# Patient Record
Sex: Female | Born: 1990 | Hispanic: Yes | Marital: Married | Smoking: Never smoker
Health system: Southern US, Community
[De-identification: ages and names within clinical notes are randomized; demographics above are authoritative.]

## PROBLEM LIST (undated history)

## (undated) ENCOUNTER — Inpatient Hospital Stay (HOSPITAL_COMMUNITY): Payer: Self-pay

## (undated) DIAGNOSIS — R519 Headache, unspecified: Secondary | ICD-10-CM

## (undated) DIAGNOSIS — Z789 Other specified health status: Secondary | ICD-10-CM

## (undated) DIAGNOSIS — R51 Headache: Secondary | ICD-10-CM

## (undated) HISTORY — PX: CYST EXCISION: SHX5701

---

## 2014-10-07 ENCOUNTER — Encounter (HOSPITAL_COMMUNITY): Payer: Self-pay | Admitting: *Deleted

## 2014-10-07 ENCOUNTER — Inpatient Hospital Stay (HOSPITAL_COMMUNITY): Payer: Medicaid Other

## 2014-10-07 ENCOUNTER — Inpatient Hospital Stay (HOSPITAL_COMMUNITY)
Admission: AD | Admit: 2014-10-07 | Discharge: 2014-10-07 | Disposition: A | Discharge status: Home / Self Care | Payer: Medicaid Other | Source: Ambulatory Visit | Attending: Obstetrics & Gynecology | Admitting: Obstetrics & Gynecology

## 2014-10-07 DIAGNOSIS — O26891 Other specified pregnancy related conditions, first trimester: Secondary | ICD-10-CM | POA: Diagnosis not present

## 2014-10-07 DIAGNOSIS — Z87891 Personal history of nicotine dependence: Secondary | ICD-10-CM | POA: Insufficient documentation

## 2014-10-07 DIAGNOSIS — Z3491 Encounter for supervision of normal pregnancy, unspecified, first trimester: Secondary | ICD-10-CM

## 2014-10-07 DIAGNOSIS — O9989 Other specified diseases and conditions complicating pregnancy, childbirth and the puerperium: Secondary | ICD-10-CM

## 2014-10-07 DIAGNOSIS — O26899 Other specified pregnancy related conditions, unspecified trimester: Secondary | ICD-10-CM

## 2014-10-07 DIAGNOSIS — Z3A01 Less than 8 weeks gestation of pregnancy: Secondary | ICD-10-CM | POA: Insufficient documentation

## 2014-10-07 DIAGNOSIS — R109 Unspecified abdominal pain: Secondary | ICD-10-CM

## 2014-10-07 LAB — CBC
HCT: 35 % — ABNORMAL LOW (ref 36.0–46.0)
Hemoglobin: 11.5 g/dL — ABNORMAL LOW (ref 12.0–15.0)
MCH: 25.2 pg — ABNORMAL LOW (ref 26.0–34.0)
MCHC: 32.9 g/dL (ref 30.0–36.0)
MCV: 76.6 fL — ABNORMAL LOW (ref 78.0–100.0)
Platelets: 255 10*3/uL (ref 150–400)
RBC: 4.57 MIL/uL (ref 3.87–5.11)
RDW: 14.6 % (ref 11.5–15.5)
WBC: 9.7 10*3/uL (ref 4.0–10.5)

## 2014-10-07 LAB — URINALYSIS, ROUTINE W REFLEX MICROSCOPIC
BILIRUBIN URINE: NEGATIVE
GLUCOSE, UA: NEGATIVE mg/dL
HGB URINE DIPSTICK: NEGATIVE
KETONES UR: NEGATIVE mg/dL
Leukocytes, UA: NEGATIVE
Nitrite: NEGATIVE
PROTEIN: NEGATIVE mg/dL
Specific Gravity, Urine: 1.015 (ref 1.005–1.030)
UROBILINOGEN UA: 0.2 mg/dL (ref 0.0–1.0)
pH: 7 (ref 5.0–8.0)

## 2014-10-07 LAB — POCT PREGNANCY, URINE: PREG TEST UR: POSITIVE — AB

## 2014-10-07 LAB — HCG, QUANTITATIVE, PREGNANCY: hCG, Beta Chain, Quant, S: 17163 m[IU]/mL — ABNORMAL HIGH (ref <5)

## 2014-10-07 NOTE — MAU Provider Note (Signed)
  History     CSN: [id number]  Arrival date and time: 10/07/14 1710   First Provider Initiated Contact with Patient 10/07/14 1945      Chief Complaint  Patient presents with  . Abdominal Pain   Abdominal Pain This is a new problem. The current episode started in the past 7 days. The onset quality is gradual. The problem occurs intermittently. The problem has been waxing and waning. The pain is located in the LLQ and RLQ. The pain is mild. The quality of the pain is cramping. The abdominal pain does not radiate. Pertinent negatives include no constipation, diarrhea, fever, frequency, headaches, nausea or vomiting. She has tried nothing for the symptoms.   Emily Barnes 24 y.o. G1P0 @ [redacted]w[redacted]d presents to the MAU stating that she just wants to make sure everything is going smoothly in the pregnancy. She does complain of abominal cramping but denies any bleeding.  History reviewed. No pertinent past medical history.  History reviewed. No pertinent past surgical history.  History reviewed. No pertinent family history.  Social History  Substance Use Topics  . Smoking status: Former Games developer  . Smokeless tobacco: None  . Alcohol Use: No    Allergies: No Known Allergies  Prescriptions prior to admission  Medication Sig Dispense Refill Last Dose  . Prenatal Vit-Fe Fumarate-FA (PRENATAL MULTIVITAMIN) TABS tablet Take 1 tablet by mouth daily at 12 noon.   10/07/2014 at Unknown time    Review of Systems  Constitutional: Negative for fever and chills.  Respiratory: Negative for shortness of breath.   Cardiovascular: Negative for chest pain.  Gastrointestinal: Positive for abdominal pain. Negative for nausea, vomiting, diarrhea and constipation.  Genitourinary: Negative for frequency.  Neurological: Negative for dizziness and headaches.  All other systems reviewed and are negative.  Physical Exam   Blood pressure 109/61, pulse 78, temperature 98.1 F (36.7 C), temperature source Oral,  resp. rate 18, height 5' 9.5" (1.765 m), weight 121.564 kg (268 lb), last menstrual period 08/21/2014.  Physical Exam  Nursing note and vitals reviewed. Constitutional: She is oriented to person, place, and time. She appears well-developed and well-nourished. No distress.  HENT:  Head: Normocephalic and atraumatic.  Respiratory: Effort normal. No respiratory distress.  GI: Soft. There is no tenderness.  Musculoskeletal: Normal range of motion.  Neurological: She is alert and oriented to person, place, and time.  Skin: Skin is warm and dry.  Psychiatric: She has a normal mood and affect. Her behavior is normal. Judgment and thought content normal.    MAU Course  Procedures  MDM Cbc, Ultrasound, beta HCG, ABO ordered. Turn ovr care to Collene Gobble, cnm  Assessment and Plan    Clemmons,Lori Grissett 10/07/2014, 7:47 PM   [place] with preliminary report of IUP with FHR 114, [place] [redacted]w[redacted]d by CRL  A: 1. Normal IUP (intrauterine pregnancy) on prenatal ultrasound, first trimester   2. Cramping affecting pregnancy, antepartum     P: D/C home F/U with early prenatal care. List of providers given. Return to MAU as needed for emergencies  LEFTWICH-KIRBY, Paizleigh Wilds, CNM 9:01 PM
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
## 2014-10-07 NOTE — Discharge Instructions (Signed)

## 2014-10-07 NOTE — MAU Note (Addendum)
PT SAYS  SHE WENT TO   [facility]-  ON 8-7-    DID UPT  - SAID  NEG.  .   ON  Friday  - HPT   SAID  POSITIVE   NO BLEEDING.  FEELS SOME CRAMPING-   FOR SEVERAL  DAYS -  NO MEDS.   LAST SEX-  8-21.  NO BIRTH  CONTROL.   HAS AN APPOINTMENT  WITH  DR  MARSHALL  ON 9-13

## 2014-10-08 LAB — ABO/RH: ABO/RH(D): AB POS

## 2014-10-28 LAB — OB RESULTS CONSOLE RPR: RPR: NONREACTIVE

## 2014-10-28 LAB — OB RESULTS CONSOLE ABO/RH: RH TYPE: POSITIVE

## 2014-10-28 LAB — OB RESULTS CONSOLE HEPATITIS B SURFACE ANTIGEN: Hepatitis B Surface Ag: NEGATIVE

## 2014-10-28 LAB — OB RESULTS CONSOLE HIV ANTIBODY (ROUTINE TESTING): HIV: NONREACTIVE

## 2014-10-28 LAB — OB RESULTS CONSOLE GC/CHLAMYDIA
Chlamydia: NEGATIVE
Gonorrhea: NEGATIVE

## 2014-10-28 LAB — OB RESULTS CONSOLE ANTIBODY SCREEN: Antibody Screen: NEGATIVE

## 2014-10-28 LAB — OB RESULTS CONSOLE RUBELLA ANTIBODY, IGM: Rubella: IMMUNE

## 2015-02-15 NOTE — L&D Delivery Note (Signed)
Delivery Note At 8:55 AM a viable female was delivered via Vaginal, Spontaneous Delivery (Presentation: Left Occiput Anterior).  APGAR: 9, 9; weight 7 lb 15.7 oz (3620 g).   Placenta status: Intact, Spontaneous.  Cord: 3 vessels with the following complications: .  Cord pH: none  Anesthesia: Epidural  Episiotomy: None Lacerations: Vaginal;1st degree Suture Repair: 3.0 vicryl rapide Est. Blood Loss (mL): 350  Mom to postpartum.  Baby to Couplet care / Skin to Skin.  Elloise Roark A 05/30/2015, 10:28 AM

## 2015-04-18 ENCOUNTER — Inpatient Hospital Stay (HOSPITAL_COMMUNITY)
Admission: AD | Admit: 2015-04-18 | Discharge: 2015-04-18 | Disposition: A | Discharge status: Home / Self Care | Payer: Medicaid Other | Source: Ambulatory Visit | Attending: Obstetrics | Admitting: Obstetrics

## 2015-04-18 ENCOUNTER — Encounter (HOSPITAL_COMMUNITY): Payer: Self-pay

## 2015-04-18 DIAGNOSIS — Z87891 Personal history of nicotine dependence: Secondary | ICD-10-CM | POA: Diagnosis not present

## 2015-04-18 DIAGNOSIS — R102 Pelvic and perineal pain: Secondary | ICD-10-CM | POA: Insufficient documentation

## 2015-04-18 DIAGNOSIS — Z3A34 34 weeks gestation of pregnancy: Secondary | ICD-10-CM | POA: Insufficient documentation

## 2015-04-18 DIAGNOSIS — O26893 Other specified pregnancy related conditions, third trimester: Secondary | ICD-10-CM | POA: Insufficient documentation

## 2015-04-18 DIAGNOSIS — E860 Dehydration: Secondary | ICD-10-CM

## 2015-04-18 DIAGNOSIS — N949 Unspecified condition associated with female genital organs and menstrual cycle: Secondary | ICD-10-CM

## 2015-04-18 DIAGNOSIS — R319 Hematuria, unspecified: Secondary | ICD-10-CM | POA: Diagnosis present

## 2015-04-18 HISTORY — DX: Headache, unspecified: R51.9

## 2015-04-18 HISTORY — DX: Headache: R51

## 2015-04-18 LAB — WET PREP, GENITAL
Clue Cells Wet Prep HPF POC: NONE SEEN
Trich, Wet Prep: NONE SEEN
Yeast Wet Prep HPF POC: NONE SEEN

## 2015-04-18 LAB — URINALYSIS, ROUTINE W REFLEX MICROSCOPIC
BILIRUBIN URINE: NEGATIVE
GLUCOSE, UA: NEGATIVE mg/dL
HGB URINE DIPSTICK: NEGATIVE
KETONES UR: 15 mg/dL — AB
Leukocytes, UA: NEGATIVE
Nitrite: NEGATIVE
PROTEIN: NEGATIVE mg/dL
Specific Gravity, Urine: 1.015 (ref 1.005–1.030)
pH: 6.5 (ref 5.0–8.0)

## 2015-04-18 NOTE — MAU Provider Note (Signed)
History     CSN: [id number]  Arrival date and time: 04/18/15 1814   First Provider Initiated Contact with Patient 04/18/15 1905      Chief Complaint  Patient presents with  . Hematuria   HPI   Ms.Saumya Hukill is a  25 y.o. female presenting with ? Hematuria. She first noticed it 2 hours ago; she noticed it in the toilet.  It looked like "drops of blood in the toilet." She denies bleeding at this time or leaking of fluid. + fetal movements. No history of placenta previa.   + bilateral lower back pain; this is not new  + pelvic pressure; this is not new although feels worse today. The pressure is located in the center of her abdomen.   Last intercourse was yesterday.   OB History    Gravida Para Term Preterm AB TAB SAB Ectopic Multiple Living   1               Past Medical History  Diagnosis Date  . Headache     History reviewed. No pertinent past surgical history.  History reviewed. No pertinent family history.  Social History  Substance Use Topics  . Smoking status: Former Games developer  . Smokeless tobacco: None  . Alcohol Use: No    Allergies: No Known Allergies  Prescriptions prior to admission  Medication Sig Dispense Refill Last Dose  . Prenatal Vit-Fe Fumarate-FA (PRENATAL MULTIVITAMIN) TABS tablet Take 1 tablet by mouth daily at 12 noon.   10/07/2014 at Unknown time   Results for orders placed or performed during the hospital encounter of 04/18/15 (from the past 48 hour(s))  Urinalysis, Routine w reflex microscopic (not at [facility])     Status: Abnormal   Collection Time: 04/18/15  6:40 PM  Result Value Ref Range   Color, Urine YELLOW YELLOW   APPearance CLEAR CLEAR   Specific Gravity, Urine 1.015 1.005 - 1.030   pH 6.5 5.0 - 8.0   Glucose, UA NEGATIVE NEGATIVE mg/dL   Hgb urine dipstick NEGATIVE NEGATIVE   Bilirubin Urine NEGATIVE NEGATIVE   Ketones, ur 15 (A) NEGATIVE mg/dL   Protein, ur NEGATIVE NEGATIVE mg/dL   Nitrite NEGATIVE NEGATIVE   Leukocytes,  UA NEGATIVE NEGATIVE    Comment: MICROSCOPIC NOT DONE ON URINES WITH NEGATIVE PROTEIN, BLOOD, LEUKOCYTES, NITRITE, OR GLUCOSE <1000 mg/dL.  Wet prep, genital     Status: Abnormal   Collection Time: 04/18/15  7:15 PM  Result Value Ref Range   Yeast Wet Prep HPF POC NONE SEEN NONE SEEN   Trich, Wet Prep NONE SEEN NONE SEEN   Clue Cells Wet Prep HPF POC NONE SEEN NONE SEEN   WBC, Wet Prep HPF POC MODERATE (A) NONE SEEN    Comment: MODERATE BACTERIA SEEN   Sperm PRESENT     Review of Systems  Constitutional: Negative for fever.  Gastrointestinal: Negative for nausea and vomiting.  Genitourinary: Negative for dysuria, urgency and frequency.  Musculoskeletal: Positive for back pain.   Physical Exam   Blood pressure 115/69, pulse 101, temperature 97.9 F (36.6 C), temperature source Oral, resp. rate 18, height  (1.753 m), weight 280 lb 9.6 oz (127.279 kg), last menstrual period 08/21/2014.  Physical Exam  Constitutional: She is oriented to person, place, and time. She appears well-developed and well-nourished. No distress.  HENT:  Head: Normocephalic.  Eyes: Pupils are equal, round, and reactive to light.  Cardiovascular: Normal rate and normal heart sounds.   Respiratory: Effort normal and breath  sounds normal.  GI: Soft. There is tenderness in the suprapubic area. There is no rigidity, no rebound, no guarding and no CVA tenderness.  Genitourinary:  Speculum exam: Vagina - Small amount of creamy discharge, no odor Cervix - No contact bleeding, no active bleeding  Bimanual exam: Cervix closed Wet prep done Chaperone present for exam.  Musculoskeletal: Normal range of motion.  Neurological: She is alert and oriented to person, place, and time.  Skin: Skin is warm. She is not diaphoretic.  Psychiatric: Her behavior is normal.   Fetal Tracing: Baseline: 130 bpm  Variability: moderate  Accelerations: 15x15 Decelerations: none   Toco: quiet   MAU Course  Procedures   None  MDM UA Urine culture pending  Assessment and Plan   A:  1. Pelvic pressure in pregnancy, antepartum, third trimester   2. Mild dehydration     P:  Discharge home in stable condition Fetal kick counts Increase PO fluid intake Return to MAU if bleeding returns Follow up with OB on Monday; call. Preterm labor precautions.   Duane LopeJennifer I Emelin Dascenzo, NP 04/18/2015 8:17 PM

## 2015-04-18 NOTE — MAU Note (Signed)
Patient states she went to the restroom saw blood in urine unsure if vaginally, having a pinching back pain and lower abdominal pressure has been happening intermittently for a while, denies any prenatal issues or problems.

## 2015-04-18 NOTE — Discharge Instructions (Signed)

## 2015-04-20 LAB — CULTURE, OB URINE: SPECIAL REQUESTS: NORMAL

## 2015-04-29 ENCOUNTER — Ambulatory Visit (INDEPENDENT_AMBULATORY_CARE_PROVIDER_SITE_OTHER): Payer: Self-pay | Admitting: Pediatrics

## 2015-04-29 DIAGNOSIS — Z3490 Encounter for supervision of normal pregnancy, unspecified, unspecified trimester: Secondary | ICD-10-CM | POA: Insufficient documentation

## 2015-04-29 DIAGNOSIS — Z7681 Expectant parent(s) prebirth pediatrician visit: Secondary | ICD-10-CM

## 2015-04-29 NOTE — Progress Notes (Signed)
Prenatal counseling for impending newborn done-- Z76.81  

## 2015-05-05 ENCOUNTER — Encounter (HOSPITAL_COMMUNITY): Payer: Self-pay

## 2015-05-05 ENCOUNTER — Inpatient Hospital Stay (HOSPITAL_COMMUNITY)
Admission: EM | Admit: 2015-05-05 | Discharge: 2015-05-05 | Disposition: A | Discharge status: Home / Self Care | Payer: Medicaid Other | Attending: Obstetrics & Gynecology | Admitting: Obstetrics & Gynecology

## 2015-05-05 ENCOUNTER — Emergency Department (HOSPITAL_COMMUNITY): Payer: Medicaid Other

## 2015-05-05 DIAGNOSIS — M25512 Pain in left shoulder: Secondary | ICD-10-CM | POA: Insufficient documentation

## 2015-05-05 DIAGNOSIS — Y92410 Unspecified street and highway as the place of occurrence of the external cause: Secondary | ICD-10-CM | POA: Insufficient documentation

## 2015-05-05 DIAGNOSIS — O9A213 Injury, poisoning and certain other consequences of external causes complicating pregnancy, third trimester: Secondary | ICD-10-CM

## 2015-05-05 DIAGNOSIS — R079 Chest pain, unspecified: Secondary | ICD-10-CM | POA: Insufficient documentation

## 2015-05-05 DIAGNOSIS — Z3A37 37 weeks gestation of pregnancy: Secondary | ICD-10-CM | POA: Insufficient documentation

## 2015-05-05 HISTORY — DX: Other specified health status: Z78.9

## 2015-05-05 LAB — CBC
HEMATOCRIT: 34.8 % — AB (ref 36.0–46.0)
Hemoglobin: 11.4 g/dL — ABNORMAL LOW (ref 12.0–15.0)
MCH: 24.8 pg — ABNORMAL LOW (ref 26.0–34.0)
MCHC: 32.8 g/dL (ref 30.0–36.0)
MCV: 75.8 fL — AB (ref 78.0–100.0)
Platelets: 252 10*3/uL (ref 150–400)
RBC: 4.59 MIL/uL (ref 3.87–5.11)
RDW: 13.9 % (ref 11.5–15.5)
WBC: 9 10*3/uL (ref 4.0–10.5)

## 2015-05-05 LAB — COMPREHENSIVE METABOLIC PANEL
ALBUMIN: 2.8 g/dL — AB (ref 3.5–5.0)
ALK PHOS: 205 U/L — AB (ref 38–126)
ALT: 27 U/L (ref 14–54)
AST: 22 U/L (ref 15–41)
Anion gap: 13 (ref 5–15)
CHLORIDE: 106 mmol/L (ref 101–111)
CO2: 19 mmol/L — ABNORMAL LOW (ref 22–32)
Calcium: 9.2 mg/dL (ref 8.9–10.3)
Creatinine, Ser: 0.79 mg/dL (ref 0.44–1.00)
GFR calc non Af Amer: >60 mL/min (ref >60)
GLUCOSE: 88 mg/dL (ref 65–99)
Potassium: 4 mmol/L (ref 3.5–5.1)
SODIUM: 138 mmol/L (ref 135–145)
Total Bilirubin: 0.3 mg/dL (ref 0.3–1.2)
Total Protein: 7.1 g/dL (ref 6.5–8.1)

## 2015-05-05 MED ORDER — SODIUM CHLORIDE 0.9 % IV BOLUS (SEPSIS)
1000.0000 mL | Freq: Once | INTRAVENOUS | Status: AC
  Administered 2015-05-05: 1000 mL via INTRAVENOUS
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
## 2015-05-05 NOTE — ED Notes (Signed)
This note also relates to the following rows which could not be included: Resp - Cannot attach notes to unvalidated device data   efm off for bedside US.  Fetal HR observed @ normal rate.  Cephalic.
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
## 2015-05-06 ENCOUNTER — Encounter (HOSPITAL_COMMUNITY): Payer: Self-pay

## 2015-05-22 ENCOUNTER — Encounter (HOSPITAL_COMMUNITY): Payer: Self-pay | Admitting: *Deleted

## 2015-05-22 ENCOUNTER — Inpatient Hospital Stay (HOSPITAL_COMMUNITY)
Admission: AD | Admit: 2015-05-22 | Discharge: 2015-05-22 | Disposition: A | Discharge status: Home / Self Care | Payer: Medicaid Other | Source: Ambulatory Visit | Attending: Obstetrics | Admitting: Obstetrics

## 2015-05-22 DIAGNOSIS — O368130 Decreased fetal movements, third trimester, not applicable or unspecified: Secondary | ICD-10-CM

## 2015-05-22 DIAGNOSIS — O368131 Decreased fetal movements, third trimester, fetus 1: Secondary | ICD-10-CM

## 2015-05-22 DIAGNOSIS — Z3A39 39 weeks gestation of pregnancy: Secondary | ICD-10-CM | POA: Diagnosis not present

## 2015-05-22 DIAGNOSIS — O368190 Decreased fetal movements, unspecified trimester, not applicable or unspecified: Secondary | ICD-10-CM | POA: Diagnosis present

## 2015-05-22 NOTE — MAU Provider Note (Signed)
  History     CSN: [id number]  Arrival date and time: 05/22/15 1857   First Provider Initiated Contact with Patient 05/22/15 2026      Chief Complaint  Patient presents with  . Decreased Fetal Movement   HPI  Emily Barnes 24 y.o. G1P0000 [id number] presents to mau saying that she has not felt the baby move since this morning. She denies contractions, vaginal bleeding , lof.  Past Medical History  Diagnosis Date  . Headache   . Medical history non-contributory     Past Surgical History  Procedure Laterality Date  . Cyst excision Left     axilla    History reviewed. No pertinent family history.  Social History  Substance Use Topics  . Smoking status: Never Smoker   . Smokeless tobacco: Never Used  . Alcohol Use: No    Allergies: No Known Allergies  No prescriptions prior to admission    Review of Systems  Constitutional: Negative for fever.  Genitourinary:       Decreased fetal movement  All other systems reviewed and are negative.  Physical Exam   Blood pressure 111/68, pulse 114, temperature 97.7 F (36.5 C), resp. rate 18, height 5\' 8"  (1.727 m), weight 279 lb 6.4 oz (126.735 kg), last menstrual period 08/21/2014.  Physical Exam  Nursing note and vitals reviewed. Constitutional: She is oriented to person, place, and time. She appears well-developed and well-nourished. No distress.  HENT:  Head: Normocephalic and atraumatic.  Cardiovascular: Normal rate.   Respiratory: Effort normal. No respiratory distress.  GI: Soft.  Neurological: She is alert and oriented to person, place, and time.  Skin: Skin is warm and dry.  Psychiatric: She has a normal mood and affect. Her behavior is normal. Judgment and thought content normal.    MAU Course  Procedures  MDM FHR-Category one Reactive tracing occasional contractions; Reassured and encouraged pt to continue kick counts  Assessment and Plan  Decreased fetal movement  Discharge  Clemmons,Lori  Grissett 05/22/2015, 8:33 PM

## 2015-05-22 NOTE — Progress Notes (Signed)
Written and verbal d/c instructions given and understanding voiced. 

## 2015-05-22 NOTE — Discharge Instructions (Signed)
Fetal Movement Counts  Patient Name: __________________________________________________ Patient Due Date: ____________________  Performing a fetal movement count is highly recommended in high-risk pregnancies, but it is good for every pregnant woman to do. Your health care provider may ask you to start counting fetal movements at 28 weeks of the pregnancy. Fetal movements often increase:  · After eating a full meal.  · After physical activity.  · After eating or drinking something sweet or cold.  · At rest.  Pay attention to when you feel the baby is most active. This will help you notice a pattern of your baby's sleep and wake cycles and what factors contribute to an increase in fetal movement. It is important to perform a fetal movement count at the same time each day when your baby is normally most active.   HOW TO COUNT FETAL MOVEMENTS  1. Find a quiet and comfortable area to sit or lie down on your left side. Lying on your left side provides the best blood and oxygen circulation to your baby.  2. Write down the day and time on a sheet of paper or in a journal.  3. Start counting kicks, flutters, swishes, rolls, or jabs in a 2-hour period. You should feel at least 10 movements within 2 hours.  4. If you do not feel 10 movements in 2 hours, wait 2-3 hours and count again. Look for a change in the pattern or not enough counts in 2 hours.  SEEK MEDICAL CARE IF:  · You feel less than 10 counts in 2 hours, tried twice.  · There is no movement in over an hour.  · The pattern is changing or taking longer each day to reach 10 counts in 2 hours.  · You feel the baby is not moving as he or she usually does.  Date: ____________ Movements: ____________ Start time: ____________ Finish time: ____________   Date: ____________ Movements: ____________ Start time: ____________ Finish time: ____________  Date: ____________ Movements: ____________ Start time: ____________ Finish time: ____________  Date: ____________ Movements:  ____________ Start time: ____________ Finish time: ____________  Date: ____________ Movements: ____________ Start time: ____________ Finish time: ____________  Date: ____________ Movements: ____________ Start time: ____________ Finish time: ____________  Date: ____________ Movements: ____________ Start time: ____________ Finish time: ____________  Date: ____________ Movements: ____________ Start time: ____________ Finish time: ____________   Date: ____________ Movements: ____________ Start time: ____________ Finish time: ____________  Date: ____________ Movements: ____________ Start time: ____________ Finish time: ____________  Date: ____________ Movements: ____________ Start time: ____________ Finish time: ____________  Date: ____________ Movements: ____________ Start time: ____________ Finish time: ____________  Date: ____________ Movements: ____________ Start time: ____________ Finish time: ____________  Date: ____________ Movements: ____________ Start time: ____________ Finish time: ____________  Date: ____________ Movements: ____________ Start time: ____________ Finish time: ____________   Date: ____________ Movements: ____________ Start time: ____________ Finish time: ____________  Date: ____________ Movements: ____________ Start time: ____________ Finish time: ____________  Date: ____________ Movements: ____________ Start time: ____________ Finish time: ____________  Date: ____________ Movements: ____________ Start time: ____________ Finish time: ____________  Date: ____________ Movements: ____________ Start time: ____________ Finish time: ____________  Date: ____________ Movements: ____________ Start time: ____________ Finish time: ____________  Date: ____________ Movements: ____________ Start time: ____________ Finish time: ____________   Date: ____________ Movements: ____________ Start time: ____________ Finish time: ____________  Date: ____________ Movements: ____________ Start time: ____________ Finish  time: ____________  Date: ____________ Movements: ____________ Start time: ____________ Finish time: ____________  Date: ____________ Movements: ____________ Start time:   ____________ Finish time: ____________  Date: ____________ Movements: ____________ Start time: ____________ Finish time: ____________  Date: ____________ Movements: ____________ Start time: ____________ Finish time: ____________  Date: ____________ Movements: ____________ Start time: ____________ Finish time: ____________   Date: ____________ Movements: ____________ Start time: ____________ Finish time: ____________  Date: ____________ Movements: ____________ Start time: ____________ Finish time: ____________  Date: ____________ Movements: ____________ Start time: ____________ Finish time: ____________  Date: ____________ Movements: ____________ Start time: ____________ Finish time: ____________  Date: ____________ Movements: ____________ Start time: ____________ Finish time: ____________  Date: ____________ Movements: ____________ Start time: ____________ Finish time: ____________  Date: ____________ Movements: ____________ Start time: ____________ Finish time: ____________   Date: ____________ Movements: ____________ Start time: ____________ Finish time: ____________  Date: ____________ Movements: ____________ Start time: ____________ Finish time: ____________  Date: ____________ Movements: ____________ Start time: ____________ Finish time: ____________  Date: ____________ Movements: ____________ Start time: ____________ Finish time: ____________  Date: ____________ Movements: ____________ Start time: ____________ Finish time: ____________  Date: ____________ Movements: ____________ Start time: ____________ Finish time: ____________  Date: ____________ Movements: ____________ Start time: ____________ Finish time: ____________   Date: ____________ Movements: ____________ Start time: ____________ Finish time: ____________  Date: ____________  Movements: ____________ Start time: ____________ Finish time: ____________  Date: ____________ Movements: ____________ Start time: ____________ Finish time: ____________  Date: ____________ Movements: ____________ Start time: ____________ Finish time: ____________  Date: ____________ Movements: ____________ Start time: ____________ Finish time: ____________  Date: ____________ Movements: ____________ Start time: ____________ Finish time: ____________  Date: ____________ Movements: ____________ Start time: ____________ Finish time: ____________   Date: ____________ Movements: ____________ Start time: ____________ Finish time: ____________  Date: ____________ Movements: ____________ Start time: ____________ Finish time: ____________  Date: ____________ Movements: ____________ Start time: ____________ Finish time: ____________  Date: ____________ Movements: ____________ Start time: ____________ Finish time: ____________  Date: ____________ Movements: ____________ Start time: ____________ Finish time: ____________  Date: ____________ Movements: ____________ Start time: ____________ Finish time: ____________     This information is not intended to replace advice given to you by your health care provider. Make sure you discuss any questions you have with your health care provider.     Document Released: 03/02/2006 Document Revised: 02/21/2014 Document Reviewed: 11/28/2011  [employer] Interactive Patient Education ©2016 [employer]

## 2015-05-22 NOTE — MAU Note (Signed)
Has only felt FM one time today. Denies LOF or bleeding and no pain

## 2015-05-28 ENCOUNTER — Inpatient Hospital Stay (HOSPITAL_COMMUNITY)
Admission: AD | Admit: 2015-05-28 | Discharge: 2015-05-28 | Disposition: A | Discharge status: Home / Self Care | Payer: Medicaid Other | Source: Ambulatory Visit | Attending: Obstetrics | Admitting: Obstetrics

## 2015-05-28 ENCOUNTER — Encounter (HOSPITAL_COMMUNITY): Payer: Self-pay | Admitting: *Deleted

## 2015-05-28 ENCOUNTER — Encounter (HOSPITAL_COMMUNITY): Payer: Self-pay

## 2015-05-28 NOTE — MAU Note (Signed)
Pt states she has been having contractions and some bleeding this am.

## 2015-05-28 NOTE — Discharge Instructions (Signed)
Third Trimester of Pregnancy °The third trimester is from week 29 through week 42, months 7 through 9. The third trimester is a time when the fetus is growing rapidly. At the end of the ninth month, the fetus is about 20 inches in length and weighs 6-10 pounds.  °BODY CHANGES °Your body goes through many changes during pregnancy. The changes vary from woman to woman.  °· Your weight will continue to increase. You can expect to gain 25-35 pounds (11-16 kg) by the end of the pregnancy. °· You may begin to get stretch marks on your hips, abdomen, and breasts. °· You may urinate more often because the fetus is moving lower into your pelvis and pressing on your bladder. °· You may develop or continue to have heartburn as a result of your pregnancy. °· You may develop constipation because certain hormones are causing the muscles that push waste through your intestines to slow down. °· You may develop hemorrhoids or swollen, bulging veins (varicose veins). °· You may have pelvic pain because of the weight gain and pregnancy hormones relaxing your joints between the bones in your pelvis. Backaches may result from overexertion of the muscles supporting your posture. °· You may have changes in your hair. These can include thickening of your hair, rapid growth, and changes in texture. Some women also have hair loss during or after pregnancy, or hair that feels dry or thin. Your hair will most likely return to normal after your baby is born. °· Your breasts will continue to grow and be tender. A yellow discharge may leak from your breasts called colostrum. °· Your belly button may stick out. °· You may feel short of breath because of your expanding uterus. °· You may notice the fetus "dropping," or moving lower in your abdomen. °· You may have a bloody mucus discharge. This usually occurs a few days to a week before labor begins. °· Your cervix becomes thin and soft (effaced) near your due date. °WHAT TO EXPECT AT YOUR PRENATAL  EXAMS  °You will have prenatal exams every 2 weeks until week 36. Then, you will have weekly prenatal exams. During a routine prenatal visit: °· You will be weighed to make sure you and the fetus are growing normally. °· Your blood pressure is taken. °· Your abdomen will be measured to track your baby's growth. °· The fetal heartbeat will be listened to. °· Any test results from the previous visit will be discussed. °· You may have a cervical check near your due date to see if you have effaced. °At around 36 weeks, your caregiver will check your cervix. At the same time, your caregiver will also perform a test on the secretions of the vaginal tissue. This test is to determine if a type of bacteria, Group B streptococcus, is present. Your caregiver will explain this further. °Your caregiver may ask you: °· What your birth plan is. °· How you are feeling. °· If you are feeling the baby move. °· If you have had any abnormal symptoms, such as leaking fluid, bleeding, severe headaches, or abdominal cramping. °· If you are using any tobacco products, including cigarettes, chewing tobacco, and electronic cigarettes. °· If you have any questions. °Other tests or screenings that may be performed during your third trimester include: °· Blood tests that check for low iron levels (anemia). °· Fetal testing to check the health, activity level, and growth of the fetus. Testing is done if you have certain medical conditions or if   there are problems during the pregnancy. °· HIV (human immunodeficiency virus) testing. If you are at high risk, you may be screened for HIV during your third trimester of pregnancy. °FALSE LABOR °You may feel small, irregular contractions that eventually go away. These are called Braxton Hicks contractions, or false labor. Contractions may last for hours, days, or even weeks before true labor sets in. If contractions come at regular intervals, intensify, or become painful, it is best to be seen by your  caregiver.  °SIGNS OF LABOR  °· Menstrual-like cramps. °· Contractions that are 5 minutes apart or less. °· Contractions that start on the top of the uterus and spread down to the lower abdomen and back. °· A sense of increased pelvic pressure or back pain. °· A watery or bloody mucus discharge that comes from the vagina. °If you have any of these signs before the 37th week of pregnancy, call your caregiver right away. You need to go to the hospital to get checked immediately. °HOME CARE INSTRUCTIONS  °· Avoid all smoking, herbs, alcohol, and unprescribed drugs. These chemicals affect the formation and growth of the baby. °· Do not use any tobacco products, including cigarettes, chewing tobacco, and electronic cigarettes. If you need help quitting, ask your health care provider. You may receive counseling support and other resources to help you quit. °· Follow your caregiver's instructions regarding medicine use. There are medicines that are either safe or unsafe to take during pregnancy. °· Exercise only as directed by your caregiver. Experiencing uterine cramps is a good sign to stop exercising. °· Continue to eat regular, healthy meals. °· Wear a good support bra for breast tenderness. °· Do not use hot tubs, steam rooms, or saunas. °· Wear your seat belt at all times when driving. °· Avoid raw meat, uncooked cheese, cat litter boxes, and soil used by cats. These carry germs that can cause birth defects in the baby. °· Take your prenatal vitamins. °· Take 1500-2000 mg of calcium daily starting at the 20th week of pregnancy until you deliver your baby. °· Try taking a stool softener (if your caregiver approves) if you develop constipation. Eat more high-fiber foods, such as fresh vegetables or fruit and whole grains. Drink plenty of fluids to keep your urine clear or pale yellow. °· Take warm sitz baths to soothe any pain or discomfort caused by hemorrhoids. Use hemorrhoid cream if your caregiver approves. °· If  you develop varicose veins, wear support hose. Elevate your feet for 15 minutes, 3-4 times a day. Limit salt in your diet. °· Avoid heavy lifting, wear low heal shoes, and practice good posture. °· Rest a lot with your legs elevated if you have leg cramps or low back pain. °· Visit your dentist if you have not gone during your pregnancy. Use a soft toothbrush to brush your teeth and be gentle when you floss. °· A sexual relationship may be continued unless your caregiver directs you otherwise. °· Do not travel far distances unless it is absolutely necessary and only with the approval of your caregiver. °· Take prenatal classes to understand, practice, and ask questions about the labor and delivery. °· Make a trial run to the hospital. °· Pack your hospital bag. °· Prepare the baby's nursery. °· Continue to go to all your prenatal visits as directed by your caregiver. °SEEK MEDICAL CARE IF: °· You are unsure if you are in labor or if your water has broken. °· You have dizziness. °· You have   mild pelvic cramps, pelvic pressure, or nagging pain in your abdominal area. °· You have persistent nausea, vomiting, or diarrhea. °· You have a bad smelling vaginal discharge. °· You have pain with urination. °SEEK IMMEDIATE MEDICAL CARE IF:  °· You have a fever. °· You are leaking fluid from your vagina. °· You have spotting or bleeding from your vagina. °· You have severe abdominal cramping or pain. °· You have rapid weight loss or gain. °· You have shortness of breath with chest pain. °· You notice sudden or extreme swelling of your face, hands, ankles, feet, or legs. °· You have not felt your baby move in over an hour. °· You have severe headaches that do not go away with medicine. °· You have vision changes. °  °This information is not intended to replace advice given to you by your health care provider. Make sure you discuss any questions you have with your health care provider. °  °Document Released: 01/25/2001 Document  Revised: 02/21/2014 Document Reviewed: 04/03/2012 °[employer] Interactive Patient Education ©2016 [employer] °Fetal Movement Counts °Patient Name: __________________________________________________ Patient Due Date: ____________________ °Performing a fetal movement count is highly recommended in high-risk pregnancies, but it is good for every pregnant woman to do. Your health care provider may ask you to start counting fetal movements at 28 weeks of the pregnancy. Fetal movements often increase: °· After eating a full meal. °· After physical activity. °· After eating or drinking something sweet or cold. °· At rest. °Pay attention to when you feel the baby is most active. This will help you notice a pattern of your baby's sleep and wake cycles and what factors contribute to an increase in fetal movement. It is important to perform a fetal movement count at the same time each day when your baby is normally most active.  °HOW TO COUNT FETAL MOVEMENTS °· Find a quiet and comfortable area to sit or lie down on your left side. Lying on your left side provides the best blood and oxygen circulation to your baby. °· Write down the day and time on a sheet of paper or in a journal. °· Start counting kicks, flutters, swishes, rolls, or jabs in a 2-hour period. You should feel at least 10 movements within 2 hours. °· If you do not feel 10 movements in 2 hours, wait 2-3 hours and count again. Look for a change in the pattern or not enough counts in 2 hours. °SEEK MEDICAL CARE IF: °· You feel less than 10 counts in 2 hours, tried twice. °· There is no movement in over an hour. °· The pattern is changing or taking longer each day to reach 10 counts in 2 hours. °· You feel the baby is not moving as he or she usually does. °Date: ____________ Movements: ____________ Start time: ____________ Finish time: ____________  °Date: ____________ Movements: ____________ Start time: ____________ Finish time: ____________ °Date: ____________  Movements: ____________ Start time: ____________ Finish time: ____________ °Date: ____________ Movements: ____________ Start time: ____________ Finish time: ____________ °Date: ____________ Movements: ____________ Start time: ____________ Finish time: ____________ °Date: ____________ Movements: ____________ Start time: ____________ Finish time: ____________ °Date: ____________ Movements: ____________ Start time: ____________ Finish time: ____________ °Date: ____________ Movements: ____________ Start time: ____________ Finish time: ____________  °Date: ____________ Movements: ____________ Start time: ____________ Finish time: ____________ °Date: ____________ Movements: ____________ Start time: ____________ Finish time: ____________ °Date: ____________ Movements: ____________ Start time: ____________ Finish time: ____________ °Date: ____________ Movements: ____________ Start time: ____________ Finish time: ____________ °Date:   ____________ Movements: ____________ Start time: ____________ Finish time: ____________ °Date: ____________ Movements: ____________ Start time: ____________ Finish time: ____________ °Date: ____________ Movements: ____________ Start time: ____________ Finish time: ____________  °Date: ____________ Movements: ____________ Start time: ____________ Finish time: ____________ °Date: ____________ Movements: ____________ Start time: ____________ Finish time: ____________ °Date: ____________ Movements: ____________ Start time: ____________ Finish time: ____________ °Date: ____________ Movements: ____________ Start time: ____________ Finish time: ____________ °Date: ____________ Movements: ____________ Start time: ____________ Finish time: ____________ °Date: ____________ Movements: ____________ Start time: ____________ Finish time: ____________ °Date: ____________ Movements: ____________ Start time: ____________ Finish time: ____________  °Date: ____________ Movements: ____________ Start time:  ____________ Finish time: ____________ °Date: ____________ Movements: ____________ Start time: ____________ Finish time: ____________ °Date: ____________ Movements: ____________ Start time: ____________ Finish time: ____________ °Date: ____________ Movements: ____________ Start time: ____________ Finish time: ____________ °Date: ____________ Movements: ____________ Start time: ____________ Finish time: ____________ °Date: ____________ Movements: ____________ Start time: ____________ Finish time: ____________ °Date: ____________ Movements: ____________ Start time: ____________ Finish time: ____________  °Date: ____________ Movements: ____________ Start time: ____________ Finish time: ____________ °Date: ____________ Movements: ____________ Start time: ____________ Finish time: ____________ °Date: ____________ Movements: ____________ Start time: ____________ Finish time: ____________ °Date: ____________ Movements: ____________ Start time: ____________ Finish time: ____________ °Date: ____________ Movements: ____________ Start time: ____________ Finish time: ____________ °Date: ____________ Movements: ____________ Start time: ____________ Finish time: ____________ °Date: ____________ Movements: ____________ Start time: ____________ Finish time: ____________  °Date: ____________ Movements: ____________ Start time: ____________ Finish time: ____________ °Date: ____________ Movements: ____________ Start time: ____________ Finish time: ____________ °Date: ____________ Movements: ____________ Start time: ____________ Finish time: ____________ °Date: ____________ Movements: ____________ Start time: ____________ Finish time: ____________ °Date: ____________ Movements: ____________ Start time: ____________ Finish time: ____________ °Date: ____________ Movements: ____________ Start time: ____________ Finish time: ____________ °Date: ____________ Movements: ____________ Start time: ____________ Finish time: ____________  °Date:  ____________ Movements: ____________ Start time: ____________ Finish time: ____________ °Date: ____________ Movements: ____________ Start time: ____________ Finish time: ____________ °Date: ____________ Movements: ____________ Start time: ____________ Finish time: ____________ °Date: ____________ Movements: ____________ Start time: ____________ Finish time: ____________ °Date: ____________ Movements: ____________ Start time: ____________ Finish time: ____________ °Date: ____________ Movements: ____________ Start time: ____________ Finish time: ____________ °Date: ____________ Movements: ____________ Start time: ____________ Finish time: ____________  °Date: ____________ Movements: ____________ Start time: ____________ Finish time: ____________ °Date: ____________ Movements: ____________ Start time: ____________ Finish time: ____________ °Date: ____________ Movements: ____________ Start time: ____________ Finish time: ____________ °Date: ____________ Movements: ____________ Start time: ____________ Finish time: ____________ °Date: ____________ Movements: ____________ Start time: ____________ Finish time: ____________ °Date: ____________ Movements: ____________ Start time: ____________ Finish time: ____________ °  °This information is not intended to replace advice given to you by your health care provider. Make sure you discuss any questions you have with your health care provider. °  °Document Released: 03/02/2006 Document Revised: 02/21/2014 Document Reviewed: 11/28/2011 °[employer] Interactive Patient Education ©2016 [employer] °Braxton Hicks Contractions °Contractions of the uterus can occur throughout pregnancy. Contractions are not always a sign that you are in labor.  °WHAT ARE BRAXTON HICKS CONTRACTIONS?  °Contractions that occur before labor are called Braxton Hicks contractions, or false labor. Toward the end of pregnancy (32-34 weeks), these contractions can develop more often and may become more  forceful. This is not true labor because these contractions do not result in opening (dilatation) and thinning of the cervix. They are sometimes difficult to tell apart from true labor because these contractions can be forceful and people have different pain tolerances. You should   not feel embarrassed if you go to the hospital with false labor. Sometimes, the only way to tell if you are in true labor is for your health care provider to look for changes in the cervix. °If there are no prenatal problems or other health problems associated with the pregnancy, it is completely safe to be sent home with false labor and await the onset of true labor. °HOW CAN YOU TELL THE DIFFERENCE BETWEEN TRUE AND FALSE LABOR? °False Labor °· The contractions of false labor are usually shorter and not as hard as those of true labor.   °· The contractions are usually irregular.   °· The contractions are often felt in the front of the lower abdomen and in the groin.   °· The contractions may go away when you walk around or change positions while lying down.   °· The contractions get weaker and are shorter lasting as time goes on.   °· The contractions do not usually become progressively stronger, regular, and closer together as with true labor.   °True Labor °· Contractions in true labor last 30-70 seconds, become very regular, usually become more intense, and increase in frequency.   °· The contractions do not go away with walking.   °· The discomfort is usually felt in the top of the uterus and spreads to the lower abdomen and low back.   °· True labor can be determined by your health care provider with an exam. This will show that the cervix is dilating and getting thinner.   °WHAT TO REMEMBER °· Keep up with your usual exercises and follow other instructions given by your health care provider.   °· Take medicines as directed by your health care provider.   °· Keep your regular prenatal appointments.   °· Eat and drink lightly if you  think you are going into labor.   °· If Braxton Hicks contractions are making you uncomfortable:   °· Change your position from lying down or resting to walking, or from walking to resting.   °· Sit and rest in a tub of warm water.   °· Drink 2-3 glasses of water. Dehydration may cause these contractions.   °· Do slow and deep breathing several times an hour.   °WHEN SHOULD I SEEK IMMEDIATE MEDICAL CARE? °Seek immediate medical care if: °· Your contractions become stronger, more regular, and closer together.   °· You have fluid leaking or gushing from your vagina.   °· You have a fever.   °· You pass blood-tinged mucus.   °· You have vaginal bleeding.   °· You have continuous abdominal pain.   °· You have low back pain that you never had before.   °· You feel your baby's head pushing down and causing pelvic pressure.   °· Your baby is not moving as much as it used to.   °  °This information is not intended to replace advice given to you by your health care provider. Make sure you discuss any questions you have with your health care provider. °  °Document Released: 01/31/2005 Document Revised: 02/05/2013 Document Reviewed: 11/12/2012 °[employer] Interactive Patient Education ©2016 [employer] ° °

## 2015-05-28 NOTE — Discharge Instructions (Signed)
Fetal Movement Counts °Patient Name: __________________________________________________ Patient Due Date: ____________________ °Performing a fetal movement count is highly recommended in high-risk pregnancies, but it is good for every pregnant woman to do. Your health care provider may ask you to start counting fetal movements at 28 weeks of the pregnancy. Fetal movements often increase: °· After eating a full meal. °· After physical activity. °· After eating or drinking something sweet or cold. °· At rest. °Pay attention to when you feel the baby is most active. This will help you notice a pattern of your baby's sleep and wake cycles and what factors contribute to an increase in fetal movement. It is important to perform a fetal movement count at the same time each day when your baby is normally most active.  °HOW TO COUNT FETAL MOVEMENTS °1. Find a quiet and comfortable area to sit or lie down on your left side. Lying on your left side provides the best blood and oxygen circulation to your baby. °2. Write down the day and time on a sheet of paper or in a journal. °3. Start counting kicks, flutters, swishes, rolls, or jabs in a 2-hour period. You should feel at least 10 movements within 2 hours. °4. If you do not feel 10 movements in 2 hours, wait 2-3 hours and count again. Look for a change in the pattern or not enough counts in 2 hours. °SEEK MEDICAL CARE IF: °· You feel less than 10 counts in 2 hours, tried twice. °· There is no movement in over an hour. °· The pattern is changing or taking longer each day to reach 10 counts in 2 hours. °· You feel the baby is not moving as he or she usually does. °Date: ____________ Movements: ____________ Start time: ____________ Finish time: ____________  °Date: ____________ Movements: ____________ Start time: ____________ Finish time: ____________ °Date: ____________ Movements: ____________ Start time: ____________ Finish time: ____________ °Date: ____________ Movements:  ____________ Start time: ____________ Finish time: ____________ °Date: ____________ Movements: ____________ Start time: ____________ Finish time: ____________ °Date: ____________ Movements: ____________ Start time: ____________ Finish time: ____________ °Date: ____________ Movements: ____________ Start time: ____________ Finish time: ____________ °Date: ____________ Movements: ____________ Start time: ____________ Finish time: ____________  °Date: ____________ Movements: ____________ Start time: ____________ Finish time: ____________ °Date: ____________ Movements: ____________ Start time: ____________ Finish time: ____________ °Date: ____________ Movements: ____________ Start time: ____________ Finish time: ____________ °Date: ____________ Movements: ____________ Start time: ____________ Finish time: ____________ °Date: ____________ Movements: ____________ Start time: ____________ Finish time: ____________ °Date: ____________ Movements: ____________ Start time: ____________ Finish time: ____________ °Date: ____________ Movements: ____________ Start time: ____________ Finish time: ____________  °Date: ____________ Movements: ____________ Start time: ____________ Finish time: ____________ °Date: ____________ Movements: ____________ Start time: ____________ Finish time: ____________ °Date: ____________ Movements: ____________ Start time: ____________ Finish time: ____________ °Date: ____________ Movements: ____________ Start time: ____________ Finish time: ____________ °Date: ____________ Movements: ____________ Start time: ____________ Finish time: ____________ °Date: ____________ Movements: ____________ Start time: ____________ Finish time: ____________ °Date: ____________ Movements: ____________ Start time: ____________ Finish time: ____________  °Date: ____________ Movements: ____________ Start time: ____________ Finish time: ____________ °Date: ____________ Movements: ____________ Start time: ____________ Finish  time: ____________ °Date: ____________ Movements: ____________ Start time: ____________ Finish time: ____________ °Date: ____________ Movements: ____________ Start time: ____________ Finish time: ____________ °Date: ____________ Movements: ____________ Start time: ____________ Finish time: ____________ °Date: ____________ Movements: ____________ Start time: ____________ Finish time: ____________ °Date: ____________ Movements: ____________ Start time: ____________ Finish time: ____________  °Date: ____________ Movements: ____________ Start time: ____________ Finish   time: ____________ °Date: ____________ Movements: ____________ Start time: ____________ Finish time: ____________ °Date: ____________ Movements: ____________ Start time: ____________ Finish time: ____________ °Date: ____________ Movements: ____________ Start time: ____________ Finish time: ____________ °Date: ____________ Movements: ____________ Start time: ____________ Finish time: ____________ °Date: ____________ Movements: ____________ Start time: ____________ Finish time: ____________ °Date: ____________ Movements: ____________ Start time: ____________ Finish time: ____________  °Date: ____________ Movements: ____________ Start time: ____________ Finish time: ____________ °Date: ____________ Movements: ____________ Start time: ____________ Finish time: ____________ °Date: ____________ Movements: ____________ Start time: ____________ Finish time: ____________ °Date: ____________ Movements: ____________ Start time: ____________ Finish time: ____________ °Date: ____________ Movements: ____________ Start time: ____________ Finish time: ____________ °Date: ____________ Movements: ____________ Start time: ____________ Finish time: ____________ °Date: ____________ Movements: ____________ Start time: ____________ Finish time: ____________  °Date: ____________ Movements: ____________ Start time: ____________ Finish time: ____________ °Date: ____________  Movements: ____________ Start time: ____________ Finish time: ____________ °Date: ____________ Movements: ____________ Start time: ____________ Finish time: ____________ °Date: ____________ Movements: ____________ Start time: ____________ Finish time: ____________ °Date: ____________ Movements: ____________ Start time: ____________ Finish time: ____________ °Date: ____________ Movements: ____________ Start time: ____________ Finish time: ____________ °Date: ____________ Movements: ____________ Start time: ____________ Finish time: ____________  °Date: ____________ Movements: ____________ Start time: ____________ Finish time: ____________ °Date: ____________ Movements: ____________ Start time: ____________ Finish time: ____________ °Date: ____________ Movements: ____________ Start time: ____________ Finish time: ____________ °Date: ____________ Movements: ____________ Start time: ____________ Finish time: ____________ °Date: ____________ Movements: ____________ Start time: ____________ Finish time: ____________ °Date: ____________ Movements: ____________ Start time: ____________ Finish time: ____________ °  °This information is not intended to replace advice given to you by your health care provider. Make sure you discuss any questions you have with your health care provider. °  °Document Released: 03/02/2006 Document Revised: 02/21/2014 Document Reviewed: 11/28/2011 °[employer] Interactive Patient Education ©2016 [employer] °Third Trimester of Pregnancy °The third trimester is from week 29 through week 42, months 7 through 9. The third trimester is a time when the fetus is growing rapidly. At the end of the ninth month, the fetus is about 20 inches in length and weighs 6-10 pounds.  °BODY CHANGES °Your body goes through many changes during pregnancy. The changes vary from woman to woman.  °· Your weight will continue to increase. You can expect to gain 25-35 pounds (11-16 kg) by the end of the pregnancy. °· You may  begin to get stretch marks on your hips, abdomen, and breasts. °· You may urinate more often because the fetus is moving lower into your pelvis and pressing on your bladder. °· You may develop or continue to have heartburn as a result of your pregnancy. °· You may develop constipation because certain hormones are causing the muscles that push waste through your intestines to slow down. °· You may develop hemorrhoids or swollen, bulging veins (varicose veins). °· You may have pelvic pain because of the weight gain and pregnancy hormones relaxing your joints between the bones in your pelvis. Backaches may result from overexertion of the muscles supporting your posture. °· You may have changes in your hair. These can include thickening of your hair, rapid growth, and changes in texture. Some women also have hair loss during or after pregnancy, or hair that feels dry or thin. Your hair will most likely return to normal after your baby is born. °· Your breasts will continue to grow and be tender. A yellow discharge may leak from your breasts called colostrum. °· Your belly button may stick out. °·   You may feel short of breath because of your expanding uterus. °· You may notice the fetus "dropping," or moving lower in your abdomen. °· You may have a bloody mucus discharge. This usually occurs a few days to a week before labor begins. °· Your cervix becomes thin and soft (effaced) near your due date. °WHAT TO EXPECT AT YOUR PRENATAL EXAMS  °You will have prenatal exams every 2 weeks until week 36. Then, you will have weekly prenatal exams. During a routine prenatal visit: °5. You will be weighed to make sure you and the fetus are growing normally. °6. Your blood pressure is taken. °7. Your abdomen will be measured to track your baby's growth. °8. The fetal heartbeat will be listened to. °9. Any test results from the previous visit will be discussed. °10. You may have a cervical check near your due date to see if you have  effaced. °At around 36 weeks, your caregiver will check your cervix. At the same time, your caregiver will also perform a test on the secretions of the vaginal tissue. This test is to determine if a type of bacteria, Group B streptococcus, is present. Your caregiver will explain this further. °Your caregiver may ask you: °· What your birth plan is. °· How you are feeling. °· If you are feeling the baby move. °· If you have had any abnormal symptoms, such as leaking fluid, bleeding, severe headaches, or abdominal cramping. °· If you are using any tobacco products, including cigarettes, chewing tobacco, and electronic cigarettes. °· If you have any questions. °Other tests or screenings that may be performed during your third trimester include: °· Blood tests that check for low iron levels (anemia). °· Fetal testing to check the health, activity level, and growth of the fetus. Testing is done if you have certain medical conditions or if there are problems during the pregnancy. °· HIV (human immunodeficiency virus) testing. If you are at high risk, you may be screened for HIV during your third trimester of pregnancy. °FALSE LABOR °You may feel small, irregular contractions that eventually go away. These are called Braxton Hicks contractions, or false labor. Contractions may last for hours, days, or even weeks before true labor sets in. If contractions come at regular intervals, intensify, or become painful, it is best to be seen by your caregiver.  °SIGNS OF LABOR  °· Menstrual-like cramps. °· Contractions that are 5 minutes apart or less. °· Contractions that start on the top of the uterus and spread down to the lower abdomen and back. °· A sense of increased pelvic pressure or back pain. °· A watery or bloody mucus discharge that comes from the vagina. °If you have any of these signs before the 37th week of pregnancy, call your caregiver right away. You need to go to the hospital to get checked immediately. °HOME CARE  INSTRUCTIONS  °· Avoid all smoking, herbs, alcohol, and unprescribed drugs. These chemicals affect the formation and growth of the baby. °· Do not use any tobacco products, including cigarettes, chewing tobacco, and electronic cigarettes. If you need help quitting, ask your health care provider. You may receive counseling support and other resources to help you quit. °· Follow your caregiver's instructions regarding medicine use. There are medicines that are either safe or unsafe to take during pregnancy. °· Exercise only as directed by your caregiver. Experiencing uterine cramps is a good sign to stop exercising. °· Continue to eat regular, healthy meals. °· Wear a good support bra for   breast tenderness. °· Do not use hot tubs, steam rooms, or saunas. °· Wear your seat belt at all times when driving. °· Avoid raw meat, uncooked cheese, cat litter boxes, and soil used by cats. These carry germs that can cause birth defects in the baby. °· Take your prenatal vitamins. °· Take 1500-2000 mg of calcium daily starting at the 20th week of pregnancy until you deliver your baby. °· Try taking a stool softener (if your caregiver approves) if you develop constipation. Eat more high-fiber foods, such as fresh vegetables or fruit and whole grains. Drink plenty of fluids to keep your urine clear or pale yellow. °· Take warm sitz baths to soothe any pain or discomfort caused by hemorrhoids. Use hemorrhoid cream if your caregiver approves. °· If you develop varicose veins, wear support hose. Elevate your feet for 15 minutes, 3-4 times a day. Limit salt in your diet. °· Avoid heavy lifting, wear low heal shoes, and practice good posture. °· Rest a lot with your legs elevated if you have leg cramps or low back pain. °· Visit your dentist if you have not gone during your pregnancy. Use a soft toothbrush to brush your teeth and be gentle when you floss. °· A sexual relationship may be continued unless your caregiver directs you  otherwise. °· Do not travel far distances unless it is absolutely necessary and only with the approval of your caregiver. °· Take prenatal classes to understand, practice, and ask questions about the labor and delivery. °· Make a trial run to the hospital. °· Pack your hospital bag. °· Prepare the baby's nursery. °· Continue to go to all your prenatal visits as directed by your caregiver. °SEEK MEDICAL CARE IF: °· You are unsure if you are in labor or if your water has broken. °· You have dizziness. °· You have mild pelvic cramps, pelvic pressure, or nagging pain in your abdominal area. °· You have persistent nausea, vomiting, or diarrhea. °· You have a bad smelling vaginal discharge. °· You have pain with urination. °SEEK IMMEDIATE MEDICAL CARE IF:  °· You have a fever. °· You are leaking fluid from your vagina. °· You have spotting or bleeding from your vagina. °· You have severe abdominal cramping or pain. °· You have rapid weight loss or gain. °· You have shortness of breath with chest pain. °· You notice sudden or extreme swelling of your face, hands, ankles, feet, or legs. °· You have not felt your baby move in over an hour. °· You have severe headaches that do not go away with medicine. °· You have vision changes. °  °This information is not intended to replace advice given to you by your health care provider. Make sure you discuss any questions you have with your health care provider. °  °Document Released: 01/25/2001 Document Revised: 02/21/2014 Document Reviewed: 04/03/2012 °[employer] Interactive Patient Education ©2016 [employer] ° °

## 2015-05-28 NOTE — [facility] Note (Signed)
Seen earlier today for labor check in [facility]; returning for 2nd labor check because ucs are stronger and she has some spotting;

## 2015-05-29 ENCOUNTER — Inpatient Hospital Stay (HOSPITAL_COMMUNITY)
Admission: AD | Admit: 2015-05-29 | Discharge: 2015-05-29 | Disposition: A | Discharge status: Home / Self Care | Payer: Medicaid Other | Source: Ambulatory Visit | Attending: Obstetrics | Admitting: Obstetrics

## 2015-05-29 ENCOUNTER — Encounter (HOSPITAL_COMMUNITY): Payer: Self-pay | Admitting: *Deleted

## 2015-05-29 ENCOUNTER — Inpatient Hospital Stay (HOSPITAL_COMMUNITY)
Admission: AD | Admit: 2015-05-29 | Discharge: 2015-05-31 | DRG: 775 | Disposition: A | Discharge status: Home / Self Care | Payer: Medicaid Other | Source: Ambulatory Visit | Attending: Obstetrics | Admitting: Obstetrics

## 2015-05-29 DIAGNOSIS — Z3A40 40 weeks gestation of pregnancy: Secondary | ICD-10-CM

## 2015-05-29 DIAGNOSIS — O480 Post-term pregnancy: Secondary | ICD-10-CM | POA: Diagnosis present

## 2015-05-29 LAB — CBC
HEMATOCRIT: 34.6 % — AB (ref 36.0–46.0)
Hemoglobin: 11.6 g/dL — ABNORMAL LOW (ref 12.0–15.0)
MCH: 25.2 pg — AB (ref 26.0–34.0)
MCHC: 33.5 g/dL (ref 30.0–36.0)
MCV: 75.1 fL — AB (ref 78.0–100.0)
PLATELETS: 271 10*3/uL (ref 150–400)
RBC: 4.61 MIL/uL (ref 3.87–5.11)
RDW: 15.2 % (ref 11.5–15.5)
WBC: 12.3 10*3/uL — ABNORMAL HIGH (ref 4.0–10.5)

## 2015-05-29 LAB — TYPE AND SCREEN
ABO/RH(D): AB POS
Antibody Screen: NEGATIVE

## 2015-05-29 MED ORDER — DEXTROSE 5 % IV SOLN
2.5000 10*6.[IU] | INTRAVENOUS | Status: DC
  Administered 2015-05-30 (×2): 2.5 10*6.[IU] via INTRAVENOUS
  Filled 2015-05-29 (×3): qty 2.5

## 2015-05-29 MED ORDER — LACTATED RINGERS IV SOLN
500.0000 mL | Freq: Once | INTRAVENOUS | Status: AC
  Administered 2015-05-30: 500 mL via INTRAVENOUS

## 2015-05-29 MED ORDER — CITRIC ACID-SODIUM CITRATE 334-500 MG/5ML PO SOLN: 30.0000 mL | ORAL | Status: DC | PRN

## 2015-05-29 MED ORDER — PENICILLIN G POTASSIUM 5000000 UNITS IJ SOLR
5.0000 10*6.[IU] | Freq: Once | INTRAVENOUS | Status: AC
  Administered 2015-05-29: 5 10*6.[IU] via INTRAVENOUS
  Filled 2015-05-29: qty 5

## 2015-05-29 MED ORDER — OXYTOCIN BOLUS FROM INFUSION
500.0000 mL | INTRAVENOUS | Status: DC
Start: 2015-05-29 — End: 2015-05-30
  Administered 2015-05-30: 500 mL via INTRAVENOUS

## 2015-05-29 MED ORDER — DIPHENHYDRAMINE HCL 50 MG/ML IJ SOLN: 12.5000 mg | INTRAMUSCULAR | Status: DC | PRN

## 2015-05-29 MED ORDER — ACETAMINOPHEN 325 MG PO TABS: 650.0000 mg | ORAL_TABLET | ORAL | Status: DC | PRN

## 2015-05-29 MED ORDER — LACTATED RINGERS IV SOLN
500.0000 mL | INTRAVENOUS | Status: DC | PRN
  Administered 2015-05-30: 500 mL via INTRAVENOUS

## 2015-05-29 MED ORDER — OXYCODONE-ACETAMINOPHEN 5-325 MG PO TABS: 1.0000 | ORAL_TABLET | ORAL | Status: DC | PRN

## 2015-05-29 MED ORDER — PHENYLEPHRINE 40 MCG/ML (10ML) SYRINGE FOR IV PUSH (FOR BLOOD PRESSURE SUPPORT): 80.0000 ug | PREFILLED_SYRINGE | INTRAVENOUS | Status: DC | PRN

## 2015-05-29 MED ORDER — OXYCODONE-ACETAMINOPHEN 5-325 MG PO TABS: 2.0000 | ORAL_TABLET | ORAL | Status: DC | PRN

## 2015-05-29 MED ORDER — LACTATED RINGERS IV SOLN
2.5000 [IU]/h | INTRAVENOUS | Status: DC
  Filled 2015-05-29: qty 4

## 2015-05-29 MED ORDER — LIDOCAINE HCL (PF) 1 % IJ SOLN
30.0000 mL | INTRAMUSCULAR | Status: DC | PRN
  Filled 2015-05-29: qty 30

## 2015-05-29 MED ORDER — PHENYLEPHRINE 40 MCG/ML (10ML) SYRINGE FOR IV PUSH (FOR BLOOD PRESSURE SUPPORT)
80.0000 ug | PREFILLED_SYRINGE | INTRAVENOUS | Status: DC | PRN
  Filled 2015-05-29: qty 20

## 2015-05-29 MED ORDER — EPHEDRINE 5 MG/ML INJ: 10.0000 mg | INTRAVENOUS | Status: DC | PRN

## 2015-05-29 MED ORDER — FLEET ENEMA 7-19 GM/118ML RE ENEM: 1.0000 | ENEMA | RECTAL | Status: DC | PRN

## 2015-05-29 MED ORDER — LACTATED RINGERS IV SOLN
INTRAVENOUS | Status: DC
  Administered 2015-05-29: 125 mL/h via INTRAVENOUS
  Administered 2015-05-30: 07:00:00 via INTRAVENOUS

## 2015-05-29 MED ORDER — FENTANYL 2.5 MCG/ML BUPIVACAINE 1/10 % EPIDURAL INFUSION (WH - ANES)
14.0000 mL/h | INTRAMUSCULAR | Status: DC | PRN
  Administered 2015-05-30: 14 mL/h via EPIDURAL
  Filled 2015-05-29: qty 125

## 2015-05-29 MED ORDER — ONDANSETRON HCL 4 MG/2ML IJ SOLN: 4.0000 mg | Freq: Four times a day (QID) | INTRAMUSCULAR | Status: DC | PRN

## 2015-05-29 MED ORDER — OXYCODONE-ACETAMINOPHEN 5-325 MG PO TABS
2.0000 | ORAL_TABLET | Freq: Once | ORAL | Status: AC
  Administered 2015-05-29: 2 via ORAL
  Filled 2015-05-29: qty 2

## 2015-05-29 NOTE — Discharge Instructions (Signed)
Fetal Movement Counts Patient Name: __________________________________________________ Patient Due Date: ____________________ Performing a fetal movement count is highly recommended in high-risk pregnancies, but it is good for every pregnant woman to do. Your health care provider may ask you to start counting fetal movements at 28 weeks of the pregnancy. Fetal movements often increase:  After eating a full meal.  After physical activity.  After eating or drinking something sweet or cold.  At rest. Pay attention to when you feel the baby is most active. This will help you notice a pattern of your baby's sleep and wake cycles and what factors contribute to an increase in fetal movement. It is important to perform a fetal movement count at the same time each day when your baby is normally most active.  HOW TO COUNT FETAL MOVEMENTS 1. Find a quiet and comfortable area to sit or lie down on your left side. Lying on your left side provides the best blood and oxygen circulation to your baby. 2. Write down the day and time on a sheet of paper or in a journal. 3. Start counting kicks, flutters, swishes, rolls, or jabs in a 2-hour period. You should feel at least 10 movements within 2 hours. 4. If you do not feel 10 movements in 2 hours, wait 2-3 hours and count again. Look for a change in the pattern or not enough counts in 2 hours. SEEK MEDICAL CARE IF:  You feel less than 10 counts in 2 hours, tried twice.  There is no movement in over an hour.  The pattern is changing or taking longer each day to reach 10 counts in 2 hours.  You feel the baby is not moving as he or she usually does. Date: ____________ Movements: ____________ Start time: ____________ Finish time: ____________  Date: ____________ Movements: ____________ Start time: ____________ Finish time: ____________ Date: ____________ Movements: ____________ Start time: ____________ Finish time: ____________ Date: ____________ Movements:  ____________ Start time: ____________ Finish time: ____________ Date: ____________ Movements: ____________ Start time: ____________ Finish time: ____________ Date: ____________ Movements: ____________ Start time: ____________ Finish time: ____________ Date: ____________ Movements: ____________ Start time: ____________ Finish time: ____________ Date: ____________ Movements: ____________ Start time: ____________ Finish time: ____________  Date: ____________ Movements: ____________ Start time: ____________ Finish time: ____________ Date: ____________ Movements: ____________ Start time: ____________ Finish time: ____________ Date: ____________ Movements: ____________ Start time: ____________ Finish time: ____________ Date: ____________ Movements: ____________ Start time: ____________ Finish time: ____________ Date: ____________ Movements: ____________ Start time: ____________ Finish time: ____________ Date: ____________ Movements: ____________ Start time: ____________ Finish time: ____________ Date: ____________ Movements: ____________ Start time: ____________ Finish time: ____________  Date: ____________ Movements: ____________ Start time: ____________ Finish time: ____________ Date: ____________ Movements: ____________ Start time: ____________ Finish time: ____________ Date: ____________ Movements: ____________ Start time: ____________ Finish time: ____________ Date: ____________ Movements: ____________ Start time: ____________ Finish time: ____________ Date: ____________ Movements: ____________ Start time: ____________ Finish time: ____________ Date: ____________ Movements: ____________ Start time: ____________ Finish time: ____________ Date: ____________ Movements: ____________ Start time: ____________ Finish time: ____________  Date: ____________ Movements: ____________ Start time: ____________ Finish time: ____________ Date: ____________ Movements: ____________ Start time: ____________ Finish  time: ____________ Date: ____________ Movements: ____________ Start time: ____________ Finish time: ____________ Date: ____________ Movements: ____________ Start time: ____________ Finish time: ____________ Date: ____________ Movements: ____________ Start time: ____________ Finish time: ____________ Date: ____________ Movements: ____________ Start time: ____________ Finish time: ____________ Date: ____________ Movements: ____________ Start time: ____________ Finish time: ____________  Date: ____________ Movements: ____________ Start time: ____________ Finish   time: ____________ Date: ____________ Movements: ____________ Start time: ____________ Finish time: ____________ Date: ____________ Movements: ____________ Start time: ____________ Finish time: ____________ Date: ____________ Movements: ____________ Start time: ____________ Finish time: ____________ Date: ____________ Movements: ____________ Start time: ____________ Finish time: ____________ Date: ____________ Movements: ____________ Start time: ____________ Finish time: ____________ Date: ____________ Movements: ____________ Start time: ____________ Finish time: ____________  Date: ____________ Movements: ____________ Start time: ____________ Finish time: ____________ Date: ____________ Movements: ____________ Start time: ____________ Finish time: ____________ Date: ____________ Movements: ____________ Start time: ____________ Finish time: ____________ Date: ____________ Movements: ____________ Start time: ____________ Finish time: ____________ Date: ____________ Movements: ____________ Start time: ____________ Finish time: ____________ Date: ____________ Movements: ____________ Start time: ____________ Finish time: ____________ Date: ____________ Movements: ____________ Start time: ____________ Finish time: ____________  Date: ____________ Movements: ____________ Start time: ____________ Finish time: ____________ Date: ____________  Movements: ____________ Start time: ____________ Finish time: ____________ Date: ____________ Movements: ____________ Start time: ____________ Finish time: ____________ Date: ____________ Movements: ____________ Start time: ____________ Finish time: ____________ Date: ____________ Movements: ____________ Start time: ____________ Finish time: ____________ Date: ____________ Movements: ____________ Start time: ____________ Finish time: ____________ Date: ____________ Movements: ____________ Start time: ____________ Finish time: ____________  Date: ____________ Movements: ____________ Start time: ____________ Finish time: ____________ Date: ____________ Movements: ____________ Start time: ____________ Finish time: ____________ Date: ____________ Movements: ____________ Start time: ____________ Finish time: ____________ Date: ____________ Movements: ____________ Start time: ____________ Finish time: ____________ Date: ____________ Movements: ____________ Start time: ____________ Finish time: ____________ Date: ____________ Movements: ____________ Start time: ____________ Finish time: ____________   This information is not intended to replace advice given to you by your health care provider. Make sure you discuss any questions you have with your health care provider.   Document Released: 03/02/2006 Document Revised: 02/21/2014 Document Reviewed: 11/28/2011 Elsevier Interactive Patient Education 2016 [employer] Braxton Hicks Contractions Contractions of the uterus can occur throughout pregnancy. Contractions are not always a sign that you are in labor.  WHAT ARE BRAXTON HICKS CONTRACTIONS?  Contractions that occur before labor are called Braxton Hicks contractions, or false labor. Toward the end of pregnancy (32-34 weeks), these contractions can develop more often and may become more forceful. This is not true labor because these contractions do not result in opening (dilatation) and thinning of  the cervix. They are sometimes difficult to tell apart from true labor because these contractions can be forceful and people have different pain tolerances. You should not feel embarrassed if you go to the hospital with false labor. Sometimes, the only way to tell if you are in true labor is for your health care provider to look for changes in the cervix. If there are no prenatal problems or other health problems associated with the pregnancy, it is completely safe to be sent home with false labor and await the onset of true labor. HOW CAN YOU TELL THE DIFFERENCE BETWEEN TRUE AND FALSE LABOR? False Labor  The contractions of false labor are usually shorter and not as hard as those of true labor.   The contractions are usually irregular.   The contractions are often felt in the front of the lower abdomen and in the groin.   The contractions may go away when you walk around or change positions while lying down.   The contractions get weaker and are shorter lasting as time goes on.   The contractions do not usually become progressively stronger, regular, and closer together as with true labor.  True Labor 5. Contractions in true   labor last 30-70 seconds, become very regular, usually become more intense, and increase in frequency.  6. The contractions do not go away with walking.  7. The discomfort is usually felt in the top of the uterus and spreads to the lower abdomen and low back.  8. True labor can be determined by your health care provider with an exam. This will show that the cervix is dilating and getting thinner.  WHAT TO REMEMBER  Keep up with your usual exercises and follow other instructions given by your health care provider.   Take medicines as directed by your health care provider.   Keep your regular prenatal appointments.   Eat and drink lightly if you think you are going into labor.   If Braxton Hicks contractions are making you uncomfortable:   Change  your position from lying down or resting to walking, or from walking to resting.   Sit and rest in a tub of warm water.   Drink 2-3 glasses of water. Dehydration may cause these contractions.   Do slow and deep breathing several times an hour.  WHEN SHOULD I SEEK IMMEDIATE MEDICAL CARE? Seek immediate medical care if:  Your contractions become stronger, more regular, and closer together.   You have fluid leaking or gushing from your vagina.   You have a fever.   You pass blood-tinged mucus.   You have vaginal bleeding.   You have continuous abdominal pain.   You have low back pain that you never had before.   You feel your baby's head pushing down and causing pelvic pressure.   Your baby is not moving as much as it used to.    This information is not intended to replace advice given to you by your health care provider. Make sure you discuss any questions you have with your health care provider.   Document Released: 01/31/2005 Document Revised: 02/05/2013 Document Reviewed: 11/12/2012 Elsevier Interactive Patient Education 2016 Elsevier Inc[employer]  

## 2015-05-29 NOTE — MAU Note (Signed)
Urine in lab 

## 2015-05-29 NOTE — MAU Note (Signed)
Contractions much worse x 3 days, no fetal movement since last night. fhr 138

## 2015-05-29 NOTE — MAU Note (Signed)
Pt states that she was told by office that her GBS results were lost by lab. Pt states that she was told that she would be treated for unknown GBS during labor. Dr Clearance CootsHarper aware and PCN orders given.

## 2015-05-29 NOTE — MAU Note (Signed)
Here this am for labor check and 3cm. Sent home after taking pain med. Now ctxs closer and stronger. Denies LOF or bleeding

## 2015-05-29 NOTE — H&P (Signed)
Emily Barnes is a 25 y.o. female presenting for UC's. Maternal Medical History:  Reason for admission: Contractions.   Contractions: Frequency: regular.   Perceived severity is moderate.    Fetal activity: Perceived fetal activity is normal.   Last perceived fetal movement was within the past hour.    Prenatal complications: no prenatal complications Prenatal Complications - Diabetes: none.    OB History    Gravida Para Term Preterm AB TAB SAB Ectopic Multiple Living   1 0 0 0 0 0 0 0       Past Medical History  Diagnosis Date  . Headache   . Medical history non-contributory    Past Surgical History  Procedure Laterality Date  . Cyst excision Left     axilla   Family History: family history is negative for Alcohol abuse, Arthritis, Asthma, Birth defects, Cancer, COPD, Depression, Diabetes, Drug abuse, Early death, Hearing loss, Heart disease, Hyperlipidemia, Hypertension, Kidney disease, Learning disabilities, Mental illness, Mental retardation, Miscarriages / Stillbirths, Stroke, Vision loss, and Varicose Veins. Social History:  reports that she has never smoked. She has never used smokeless tobacco. She reports that she does not drink alcohol or use illicit drugs.   Prenatal Transfer Tool  Maternal Diabetes: No Genetic Screening: Declined Maternal Ultrasounds/Referrals: Normal Fetal Ultrasounds or other Referrals:  None Maternal Substance Abuse:  No Significant Maternal Medications:  None Significant Maternal Lab Results:  None Other Comments:  None  Review of Systems  All other systems reviewed and are negative.   Dilation: 4 Effacement (%): 90 Station: -3, -2 Exam by:: Camelia Enganielle Simpson RN Blood pressure 115/73, pulse 118, temperature 98.5 F (36.9 C), resp. rate 20, height 5\' 8"  (1.727 m), weight 279 lb (126.554 kg), last menstrual period 08/21/2014. Maternal Exam:  Uterine Assessment: Contraction frequency is regular.   Abdomen: Patient reports no  abdominal tenderness. Fetal presentation: vertex  Introitus: Normal vulva. Normal vagina.  Cervix: Cervix evaluated by digital exam.     Physical Exam  Nursing note and vitals reviewed. Constitutional: She is oriented to person, place, and time. She appears well-developed and well-nourished.  HENT:  Head: Normocephalic and atraumatic.  Eyes: Conjunctivae are normal. Pupils are equal, round, and reactive to light.  Neck: Normal range of motion. Neck supple.  Cardiovascular: Normal rate and regular rhythm.   Respiratory: Effort normal and breath sounds normal.  GI: Soft. Bowel sounds are normal.  Genitourinary: Vagina normal and uterus normal.  Musculoskeletal: Normal range of motion.  Neurological: She is alert and oriented to person, place, and time.  Skin: Skin is warm and dry.  Psychiatric: She has a normal mood and affect. Her behavior is normal. Judgment and thought content normal.    Prenatal labs: ABO, Rh: --/--/AB POS (04/14 2205) Antibody: PENDING (04/14 2205) Rubella: Immune (09/13 0000) RPR: Nonreactive (09/13 0000)  HBsAg: Negative (09/13 0000)  HIV: Non-reactive (09/13 0000)  GBS:     Assessment/Plan: Postdates.  Early labor.  Admit.   Karson Reede A 05/29/2015, 10:49 PM

## 2015-05-30 ENCOUNTER — Inpatient Hospital Stay (HOSPITAL_COMMUNITY): Payer: Medicaid Other | Admitting: Anesthesiology

## 2015-05-30 ENCOUNTER — Encounter (HOSPITAL_COMMUNITY): Payer: Self-pay | Admitting: Anesthesiology

## 2015-05-30 LAB — RPR: RPR Ser Ql: NONREACTIVE

## 2015-05-30 MED ORDER — PRENATAL MULTIVITAMIN CH
1.0000 | ORAL_TABLET | Freq: Every day | ORAL | Status: DC
  Administered 2015-05-30 – 2015-05-31 (×2): 1 via ORAL
  Filled 2015-05-30 (×2): qty 1

## 2015-05-30 MED ORDER — COCONUT OIL OIL: 1.0000 "application " | TOPICAL_OIL | Status: DC | PRN

## 2015-05-30 MED ORDER — SIMETHICONE 80 MG PO CHEW: 80.0000 mg | CHEWABLE_TABLET | ORAL | Status: DC | PRN

## 2015-05-30 MED ORDER — LIDOCAINE HCL (PF) 1 % IJ SOLN
INTRAMUSCULAR | Status: DC | PRN
  Administered 2015-05-30 (×2): 6 mL

## 2015-05-30 MED ORDER — OXYCODONE-ACETAMINOPHEN 5-325 MG PO TABS: 1.0000 | ORAL_TABLET | ORAL | Status: DC | PRN

## 2015-05-30 MED ORDER — WITCH HAZEL-GLYCERIN EX PADS: 1.0000 "application " | MEDICATED_PAD | CUTANEOUS | Status: DC | PRN

## 2015-05-30 MED ORDER — OXYTOCIN 10 UNIT/ML IJ SOLN: 2.5000 [IU]/h | INTRAVENOUS | Status: DC | PRN

## 2015-05-30 MED ORDER — ONDANSETRON HCL 4 MG PO TABS: 4.0000 mg | ORAL_TABLET | ORAL | Status: DC | PRN

## 2015-05-30 MED ORDER — TETANUS-DIPHTH-ACELL PERTUSSIS 5-2.5-18.5 LF-MCG/0.5 IM SUSP: 0.5000 mL | Freq: Once | INTRAMUSCULAR | Status: DC

## 2015-05-30 MED ORDER — ACETAMINOPHEN 325 MG PO TABS: 650.0000 mg | ORAL_TABLET | ORAL | Status: DC | PRN

## 2015-05-30 MED ORDER — ZOLPIDEM TARTRATE 5 MG PO TABS: 5.0000 mg | ORAL_TABLET | Freq: Every evening | ORAL | Status: DC | PRN

## 2015-05-30 MED ORDER — SENNOSIDES-DOCUSATE SODIUM 8.6-50 MG PO TABS
2.0000 | ORAL_TABLET | ORAL | Status: DC
  Administered 2015-05-31: 2 via ORAL
  Filled 2015-05-30: qty 2

## 2015-05-30 MED ORDER — ONDANSETRON HCL 4 MG/2ML IJ SOLN: 4.0000 mg | INTRAMUSCULAR | Status: DC | PRN

## 2015-05-30 MED ORDER — DIPHENHYDRAMINE HCL 25 MG PO CAPS: 25.0000 mg | ORAL_CAPSULE | Freq: Four times a day (QID) | ORAL | Status: DC | PRN

## 2015-05-30 MED ORDER — LACTATED RINGERS IV SOLN
500.0000 mL | Freq: Once | INTRAVENOUS | Status: AC
Start: 2015-05-30 — End: 2015-05-30
  Administered 2015-05-30: 500 mL via INTRAVENOUS

## 2015-05-30 MED ORDER — OXYCODONE-ACETAMINOPHEN 5-325 MG PO TABS: 2.0000 | ORAL_TABLET | ORAL | Status: DC | PRN

## 2015-05-30 MED ORDER — BENZOCAINE-MENTHOL 20-0.5 % EX AERO
1.0000 "application " | INHALATION_SPRAY | CUTANEOUS | Status: DC | PRN
  Filled 2015-05-30: qty 56

## 2015-05-30 MED ORDER — IBUPROFEN 600 MG PO TABS
600.0000 mg | ORAL_TABLET | Freq: Four times a day (QID) | ORAL | Status: DC
Start: 2015-05-30 — End: 2015-05-31
  Administered 2015-05-30 – 2015-05-31 (×4): 600 mg via ORAL
  Filled 2015-05-30 (×5): qty 1

## 2015-05-30 MED ORDER — DIBUCAINE 1 % RE OINT: 1.0000 "application " | TOPICAL_OINTMENT | RECTAL | Status: DC | PRN

## 2015-05-30 NOTE — Anesthesia Procedure Notes (Signed)
Epidural Patient location during procedure: OB  Staffing Anesthesiologist: Jaslyne Beeck  Preanesthetic Checklist Completed: patient identified, site marked, surgical consent, pre-op evaluation, timeout performed, IV checked, risks and benefits discussed and monitors and equipment checked  Epidural Patient position: sitting Prep: DuraPrep Patient monitoring: heart rate and blood pressure Approach: midline Location: L4-L5 Injection technique: LOR air  Needle:  Needle type: Tuohy  Needle gauge: 17 G Needle insertion depth: 7 cm Catheter type: closed end flexible Catheter size: 19 Gauge Catheter at skin depth: 12 cm Test dose: negative and Other  Assessment Events: blood not aspirated, injection not painful, no injection resistance, negative IV test and no paresthesia  Additional Notes Reason for block:procedure for pain   

## 2015-05-30 NOTE — Progress Notes (Signed)
Emily MacleodFrangelyca Barnes is a 25 y.o. G1P0000 at 3063w3d by LMP admitted for active labor  Subjective:   Objective: BP 103/61 mmHg  Pulse 89  Temp(Src) 98.6 F (37 C) (Oral)  Resp 16  Ht 5\' 8"  (1.727 m)  Wt 279 lb (126.554 kg)  BMI 42.43 kg/m2  SpO2 99%  LMP 08/21/2014 (Exact Date)      FHT:  FHR: 140 bpm, variability: moderate,  accelerations:  Present,  decelerations:  Absent UC:   regular, every 2-4 minutes SVE:   Dilation: 7.5 Effacement (%): 90 Station: +1 Exam by:: Lorin MercyKayla Rouse, RN  Labs: Lab Results  Component Value Date   WBC 12.3* 05/29/2015   HGB 11.6* 05/29/2015   HCT 34.6* 05/29/2015   MCV 75.1* 05/29/2015   PLT 271 05/29/2015    Assessment / Plan: Spontaneous labor, progressing normally  Labor: Progressing normally Preeclampsia:  n/a Fetal Wellbeing:  Category I Pain Control:  Epidural I/D:  n/a Anticipated MOD:  NSVD  HARPER,CHARLES A 05/30/2015, 7:12 AM

## 2015-05-30 NOTE — Progress Notes (Signed)
Emily Barnes is a [age] y.o. G1P0000 at 3458w3d by LMP admitted for active labor  Subjective:   Objective: BP 125/67 mmHg  Pulse 91  Temp(Src) 98.1 F (36.7 C) (Axillary)  Resp 18  Ht 5\' 8"  (1.727 m)  Wt 279 lb (126.554 kg)  BMI 42.43 kg/m2  SpO2 99%  LMP 08/21/2014 (Exact Date)      FHT:  FHR: 135 bpm, variability: moderate,  accelerations:  Present,  decelerations:  Absent UC:   regular, every 2-3 minutes SVE:   Dilation: 10 Effacement (%): 100 Station: +1, +2 Exam by:: Emily Smartachel Schmidt RN  Labs: Lab Results  Component Value Date   WBC 12.3* 05/29/2015   HGB 11.6* 05/29/2015   HCT 34.6* 05/29/2015   MCV 75.1* 05/29/2015   PLT 271 05/29/2015    Assessment / Plan: Spontaneous labor, progressing normally  Labor: Progressing normally Preeclampsia:  n/a Fetal Wellbeing:  Category I Pain Control:  Epidural I/D:  n/a Anticipated MOD:  NSVD  Uma Jerde A 05/30/2015, 7:36 AM

## 2015-05-30 NOTE — Anesthesia Preprocedure Evaluation (Addendum)
Anesthesia Evaluation  Patient identified by MRN, date of birth, ID band Patient awake    Reviewed: Allergy & Precautions, NPO status , Patient's Chart, lab work & pertinent test results  Airway Mallampati: II  TM Distance: >3 FB Neck ROM: Full    Dental no notable dental hx.    Pulmonary neg pulmonary ROS,    Pulmonary exam normal breath sounds clear to auscultation       Cardiovascular negative cardio ROS Normal cardiovascular exam Rhythm:Regular Rate:Normal     Neuro/Psych  Headaches, negative psych ROS   GI/Hepatic negative GI ROS, Neg liver ROS,   Endo/Other  Morbid obesity  Renal/GU negative Renal ROS  negative genitourinary   Musculoskeletal negative musculoskeletal ROS (+)   Abdominal (+) + obese,   Peds negative pediatric ROS (+)  Hematology negative hematology ROS (+)   Anesthesia Other Findings   Reproductive/Obstetrics negative OB ROS                             Anesthesia Physical Anesthesia Plan  ASA: III  Anesthesia Plan: Epidural   Post-op Pain Management:    Induction:   Airway Management Planned: Natural Airway  Additional Equipment:   Intra-op Plan:   Post-operative Plan:   Informed Consent: I have reviewed the patients History and Physical, chart, labs and discussed the procedure including the risks, benefits and alternatives for the proposed anesthesia with the patient or authorized representative who has indicated his/her understanding and acceptance.   Dental advisory given  Plan Discussed with: CRNA  Anesthesia Plan Comments: (Informed consent obtained prior to proceeding including risk of failure, 1% risk of PDPH, risk of minor discomfort and bruising.  Discussed rare but serious complications including epidural abscess, permanent nerve injury, epidural hematoma.  Discussed alternatives to epidural analgesia and patient desires to proceed.  Timeout  performed pre-procedure verifying patient name, procedure, and platelet count.  Patient tolerated procedure well. )        Anesthesia Quick Evaluation

## 2015-05-30 NOTE — Lactation Note (Signed)
This note was copied from a baby's chart. Lactation Consultation Note  Patient Name: Emily Barnes FAOZH'YToday's Date: 05/30/2015 Reason for consult: Initial assessment  Baby 8 hours old. Mom reports that she is having more trouble latching baby to right breast. Assisted with latching baby in football position to right breast. Baby sleepy at breast. Demonstrated how to stimulate baby to suckle, but baby not wanting to latch. Mom able to hand express colostrum, and a few drops were given to baby with this LC's gloved finger. Enc mom to continue to offer STS and attempts at latching. Mom reports that football position more comfortable. Discussed normal newborn behavior with parents. [facility], aware of [facility]/BFSG and LC phone line assistance after D/C.  Maternal Data Has patient been taught Hand Expression?: Yes Does the patient have breastfeeding experience prior to this delivery?: No  Feeding Feeding Type: Breast Fed Length of feed:  (LC assessed first 10 minutes of BF.)  LATCH Score/Interventions Latch: Repeated attempts needed to sustain latch, nipple held in mouth throughout feeding, stimulation needed to elicit sucking reflex.  Audible Swallowing: None Intervention(s): Skin to skin;Hand expression Intervention(s): Skin to skin  Type of Nipple: Everted at rest and after stimulation  Comfort (Breast/Nipple): Soft / non-tender     Hold (Positioning): Assistance needed to correctly position infant at breast and maintain latch. Intervention(s): Breastfeeding basics reviewed;Support Pillows;Position options;Skin to skin  LATCH Score: 6  Lactation Tools Discussed/Used     Consult Status Consult Status: Follow-up Date: 05/31/15 Follow-up type: In-patient    Geralynn OchsWILLIARD, Adriana Lina 05/30/2015, 5:11 PM

## 2015-05-30 NOTE — Anesthesia Postprocedure Evaluation (Signed)
Anesthesia Post Note  Patient: Emily Barnes  Procedure(s) Performed: * No procedures listed *  Patient location during evaluation: Mother Baby Anesthesia Type: Epidural Level of consciousness: awake and alert Pain management: pain level controlled Vital Signs Assessment: post-procedure vital signs reviewed and stable Respiratory status: spontaneous breathing Cardiovascular status: stable Postop Assessment: patient able to bend at knees, no signs of nausea or vomiting and adequate PO intake Anesthetic complications: no    Last Vitals:  Filed Vitals:   05/30/15 0946 05/30/15 1000  BP: 116/80 100/63  Pulse: 100 105  Temp:    Resp:      Last Pain:  Filed Vitals:   05/30/15 1012  PainSc: 0-No pain                 Armonee Bojanowski Hristova

## 2015-05-31 ENCOUNTER — Encounter (HOSPITAL_COMMUNITY): Payer: Self-pay | Admitting: *Deleted

## 2015-05-31 LAB — CBC
HCT: 30.2 % — ABNORMAL LOW (ref 36.0–46.0)
Hemoglobin: 9.9 g/dL — ABNORMAL LOW (ref 12.0–15.0)
MCH: 24.8 pg — AB (ref 26.0–34.0)
MCHC: 32.8 g/dL (ref 30.0–36.0)
MCV: 75.7 fL — ABNORMAL LOW (ref 78.0–100.0)
PLATELETS: 226 10*3/uL (ref 150–400)
RBC: 3.99 MIL/uL (ref 3.87–5.11)
RDW: 15.6 % — ABNORMAL HIGH (ref 11.5–15.5)
WBC: 9.4 10*3/uL (ref 4.0–10.5)

## 2015-05-31 MED ORDER — OXYCODONE-ACETAMINOPHEN 5-325 MG PO TABS: 1.0000 | ORAL_TABLET | ORAL | Status: DC | PRN

## 2015-05-31 MED ORDER — IBUPROFEN 600 MG PO TABS: 600.0000 mg | ORAL_TABLET | Freq: Four times a day (QID) | ORAL | Status: DC | PRN

## 2015-05-31 NOTE — Progress Notes (Signed)
Post Partum Day 1 Subjective: no complaints  Objective: Blood pressure 97/50, pulse 104, temperature 97.6 F (36.4 C), temperature source Oral, resp. rate 18, height 5\' 8"  (1.727 m), weight 279 lb (126.554 kg), last menstrual period 08/21/2014, SpO2 99 %, unknown if currently breastfeeding.  Physical Exam:  General: alert and no distress Lochia: appropriate Uterine Fundus: firm Incision: none DVT Evaluation: No evidence of DVT seen on physical exam.   Recent Labs  05/29/15 2205  HGB 11.6*  HCT 34.6*    Assessment/Plan: Discharge home   LOS: 2 days   HARPER,CHARLES A 05/31/2015, 6:53 AM

## 2015-05-31 NOTE — Discharge Summary (Signed)
Obstetric Discharge Summary Reason for Admission: onset of labor Prenatal Procedures: ultrasound Intrapartum Procedures: spontaneous vaginal delivery Postpartum Procedures: none Complications-Operative and Postpartum: none HEMOGLOBIN  Date Value Ref Range Status  05/29/2015 11.6* 12.0 - 15.0 g/dL Final   HCT  Date Value Ref Range Status  05/29/2015 34.6* 36.0 - 46.0 % Final    Physical Exam:  General: alert and no distress Lochia: appropriate Uterine Fundus: firm Incision: none DVT Evaluation: No evidence of DVT seen on physical exam.  Discharge Diagnoses: Term Pregnancy-delivered  Discharge Information: Date: 05/31/2015 Activity: pelvic rest Diet: routine Medications: PNV, Ibuprofen, Colace and Percocet Condition: stable Instructions: refer to practice specific booklet Discharge to: home Follow-up Information    Follow up with MARSHALL,BERNARD A, MD. Schedule an appointment as soon as possible for a visit in 6 weeks.   Specialty:  Obstetrics and Gynecology   Contact information:   [address] [place] [address] [phone]       Newborn Data: Live born female  Birth Weight: 7 lb 15.7 oz (3620 g) APGAR: 9, 9  Home with mother.  Urvi Imes A 05/31/2015, 6:57 AM

## 2015-07-16 ENCOUNTER — Encounter: Payer: Self-pay | Admitting: Pediatrics

## 2015-11-09 ENCOUNTER — Encounter: Payer: Self-pay | Admitting: *Deleted

## 2016-08-31 ENCOUNTER — Emergency Department (HOSPITAL_COMMUNITY)
Admission: EM | Admit: 2016-08-31 | Discharge: 2016-08-31 | Disposition: A | Discharge status: Home / Self Care | Payer: Medicaid Other | Attending: Emergency Medicine | Admitting: Emergency Medicine

## 2016-08-31 ENCOUNTER — Encounter (HOSPITAL_COMMUNITY): Payer: Self-pay | Admitting: Emergency Medicine

## 2016-08-31 DIAGNOSIS — S91201A Unspecified open wound of right great toe with damage to nail, initial encounter: Secondary | ICD-10-CM | POA: Diagnosis not present

## 2016-08-31 DIAGNOSIS — Y92009 Unspecified place in unspecified non-institutional (private) residence as the place of occurrence of the external cause: Secondary | ICD-10-CM | POA: Insufficient documentation

## 2016-08-31 DIAGNOSIS — Y9389 Activity, other specified: Secondary | ICD-10-CM | POA: Insufficient documentation

## 2016-08-31 DIAGNOSIS — S91209A Unspecified open wound of unspecified toe(s) with damage to nail, initial encounter: Secondary | ICD-10-CM

## 2016-08-31 DIAGNOSIS — M79674 Pain in right toe(s): Secondary | ICD-10-CM

## 2016-08-31 DIAGNOSIS — S99921A Unspecified injury of right foot, initial encounter: Secondary | ICD-10-CM | POA: Diagnosis present

## 2016-08-31 DIAGNOSIS — W230XXA Caught, crushed, jammed, or pinched between moving objects, initial encounter: Secondary | ICD-10-CM | POA: Insufficient documentation

## 2016-08-31 DIAGNOSIS — Y999 Unspecified external cause status: Secondary | ICD-10-CM | POA: Insufficient documentation

## 2016-08-31 MED ORDER — TRAMADOL HCL 50 MG PO TABS: 50.0000 mg | ORAL_TABLET | Freq: Four times a day (QID) | ORAL | 0 refills | Status: DC | PRN

## 2016-08-31 NOTE — ED Provider Notes (Signed)
[facility]-EMERGENCY DEPT Provider Note   CSN: [id number] Arrival date & time: 08/31/16  1849     History   Chief Complaint Chief Complaint  Patient presents with  . toenail injury    HPI Emily Barnes is a 26 y.o. female who presents with right great toe pain. She states that 4 days ago her daughter closed the refrigerator door on her toe which caused it to bend backward and loosen. The toenail then came off the next day. Since then she has been keeping it clean but has noticed a clear and yellow drainage coming from it. She reports difficulty walking due to the pain. She is concerned that there is still part of the nail attached which will cause the new nail   HPI  Past Medical History:  Diagnosis Date  . Headache   . Medical history non-contributory     Patient Active Problem List   Diagnosis Date Noted  . NSVD (normal spontaneous vaginal delivery) 05/30/2015  . Indication for care in labor or delivery 05/29/2015  . Pre-birth visit for expectant mother 04/29/2015    Past Surgical History:  Procedure Laterality Date  . CYST EXCISION Left    axilla    OB History    Gravida Para Term Preterm AB Living   1 1 1  0 0 1   SAB TAB Ectopic Multiple Live Births   0 0 0 0 1       Home Medications    Prior to Admission medications   Medication Sig Start Date End Date Taking? Authorizing Provider  ibuprofen (ADVIL,MOTRIN) 200 MG tablet Take 400 mg by mouth every 6 (six) hours as needed for moderate pain.   Yes [provider]    Family History Family History  Problem Relation Age of Onset  . Alcohol abuse Neg Hx   . Arthritis Neg Hx   . Asthma Neg Hx   . Birth defects Neg Hx   . Cancer Neg Hx   . COPD Neg Hx   . Depression Neg Hx   . Diabetes Neg Hx   . Drug abuse Neg Hx   . Early death Neg Hx   . Hearing loss Neg Hx   . Heart disease Neg Hx   . Hyperlipidemia Neg Hx   . Hypertension Neg Hx   . Kidney disease Neg Hx   . Learning disabilities Neg Hx    . Mental illness Neg Hx   . Mental retardation Neg Hx   . Miscarriages / Stillbirths Neg Hx   . Stroke Neg Hx   . Vision loss Neg Hx   . Varicose Veins Neg Hx     Social History Social History  Substance Use Topics  . Smoking status: Never Smoker  . Smokeless tobacco: Never Used  . Alcohol use No     Allergies   Patient has no known allergies.   Review of Systems Review of Systems  Constitutional: Negative for fever.  Musculoskeletal: Positive for arthralgias and gait problem.       +avulsed nail  Skin: Negative for wound.     Physical Exam Updated Vital Signs LMP 07/20/2016   Physical Exam  Constitutional: She is oriented to person, place, and time. She appears well-developed and well-nourished. No distress.  HENT:  Head: Normocephalic and atraumatic.  Eyes: Pupils are equal, round, and reactive to light. Conjunctivae are normal. Right eye exhibits no discharge. Left eye exhibits no discharge. No scleral icterus.  Neck: Normal range of motion.  Cardiovascular: Normal rate.   Pulmonary/Chest: Effort normal. No respiratory distress.  Abdominal: She exhibits no distension.  Musculoskeletal:  Right great toe: No swelling, redness, or warmth. No drainage seen. Nail is avulsed. The is possibly a small piece of nail over the right part of the nail bed but appears minimal.   Neurological: She is alert and oriented to person, place, and time.  Skin: Skin is warm and dry.  Psychiatric: She has a normal mood and affect. Her behavior is normal.  Nursing note and vitals reviewed.    ED Treatments / Results  Labs (all labs ordered are listed, but only abnormal results are displayed) Labs Reviewed - No data to display  EKG  EKG Interpretation None       Radiology No results found.  Procedures Procedures (including critical care time)  Medications Ordered in ED Medications - No data to display   Initial Impression / Assessment and Plan / ED Course  I have  reviewed the triage vital signs and the nursing notes.  Pertinent labs & imaging results that were available during my care of the patient were reviewed by me and considered in my medical decision making (see chart for details).  26 year old female with right great toe avulsion. Does not appear infected at this time - likely this is just due to epithelialization of nail matrix. She has possibly has a small piece of nail over the lateral side of the nail bed. I discussed referral to podiatry vs digital toe block and she opted for referral. She is requesting something to help her walk since she is having difficulty with this. Will try a Camwalker. Advised warm soaks, neosporin, dressings, and f/u with podiatry.  Final Clinical Impressions(s) / ED Diagnoses   Final diagnoses:  Great toe pain, right  Nail avulsion of toe, initial encounter    New Prescriptions New Prescriptions   No medications on file     Beryle Quant 08/31/16 2115    Mancel Bale, MD 08/31/16 [phone]

## 2016-08-31 NOTE — ED Triage Notes (Signed)
Pt reports her child slammed the refrigerator door while her R foot is under it, her R big toe got caught on it.  States her nail fell off Sunday but the pain is getting worse and noted purulent drainage.  Swelling and redness noted, but no drainage.  Pt reports pain is worse with weight bearing.

## 2016-08-31 NOTE — ED Triage Notes (Signed)
Pt from home with complaints of right great toenail pain. Pt states she accidentally got her right great toe stuck under the refrigerator door on Saturday which injured her toenail. Pt states toenail fell off the following day. Pt states she has been cleaning the toe with hydrogen peroxide. Pt states pain is 8/10. Pt states she has had serosanguinous drainage from toenail bed today. Pt is not febrile nor tachycardic.

## 2016-08-31 NOTE — Discharge Instructions (Signed)
Keep toe clean - wash with soap and water and apply antibiotic ointment and dressing Use camwalker as needed Follow up with podiatry Return for worsening symptoms

## 2016-08-31 NOTE — ED Notes (Signed)
Pt reports her R great toe feels much better with the cam walker

## 2016-08-31 NOTE — Progress Notes (Signed)
Orthopedic Tech Progress Note Patient Details:  Emily MacleodFrangelyca Barnes 10/30/1990 [id number]  Ortho Devices Type of Ortho Device: Crutches, CAM walker   [name] 08/31/2016, 9:37 PM

## 2017-04-05 IMAGING — US US OB COMP LESS 14 WK
1 series · 14 of 28 positions shown · non-contrast
Comparison: None.

CLINICAL DATA: Pregnant patient. Cramping. Patient is 6 weeks and 5
days pregnant based on the last menstrual period.

EXAM:
OBSTETRIC <14 WK US AND TRANSVAGINAL OB US
TECHNIQUE: Both transabdominal and transvaginal ultrasound examinations were
performed for complete evaluation of the gestation as well as the
maternal uterus, adnexal regions, and pelvic cul-de-sac.
Transvaginal technique was performed to assess early pregnancy.

[Series 1: us ob follow up · 65 acquisitions, 14 frames shown]
[im 3/65]
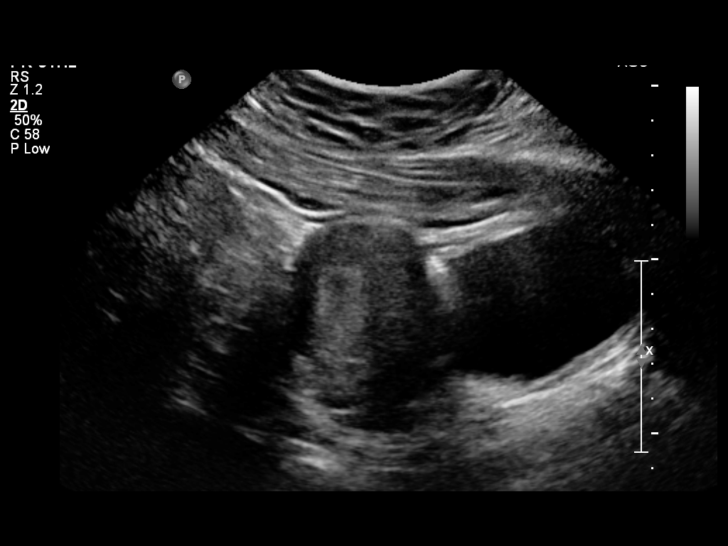
[im 8/65]
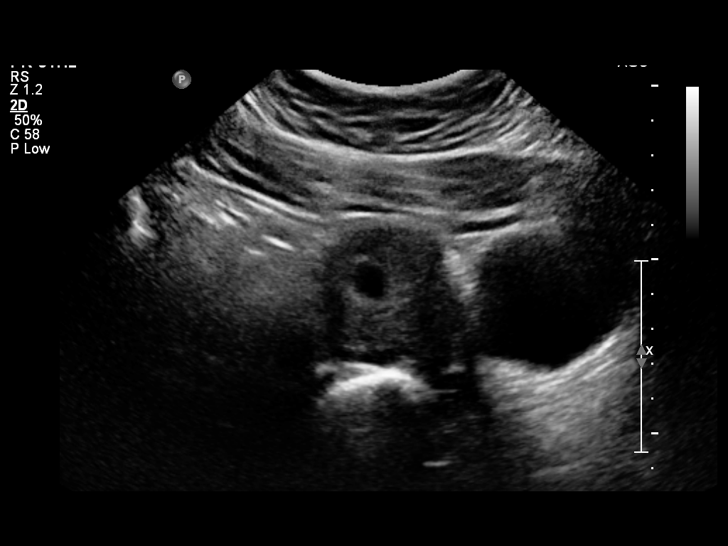
[im 12/65]
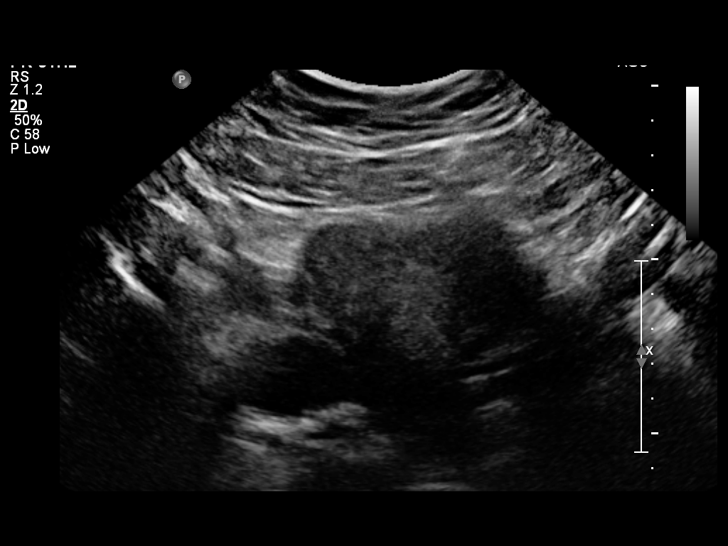
[im 17/65]
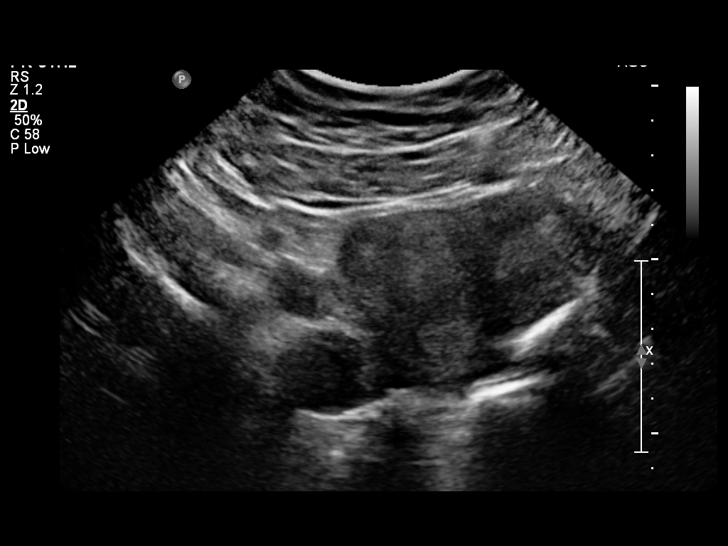
[im 22/65]
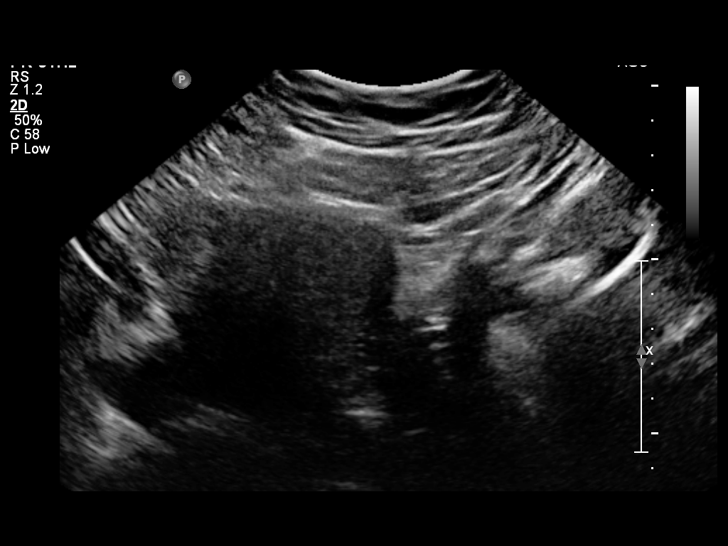
[im 27/65]
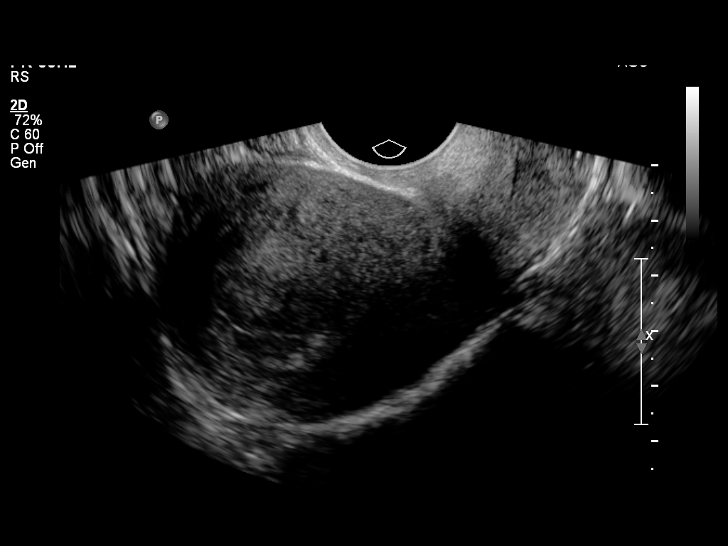
[im 31/65]
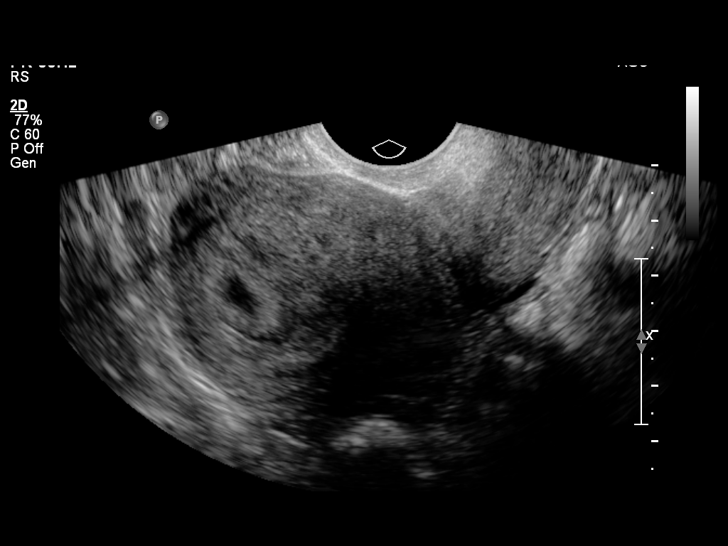
[im 36/65]
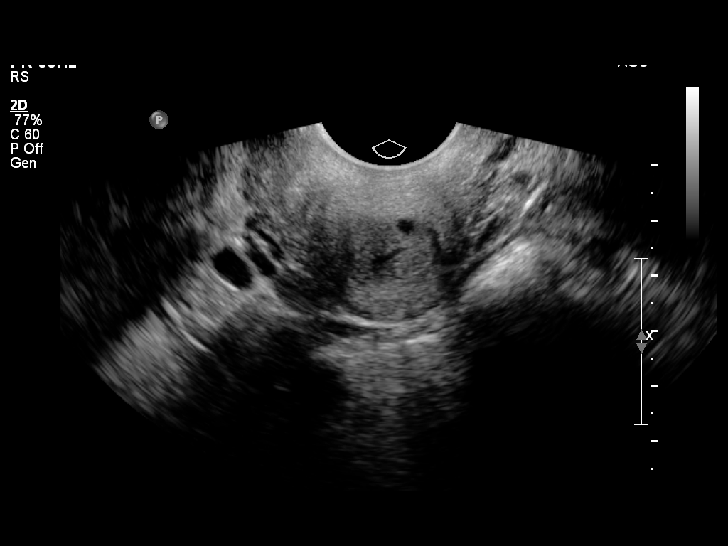
[im 41/65]
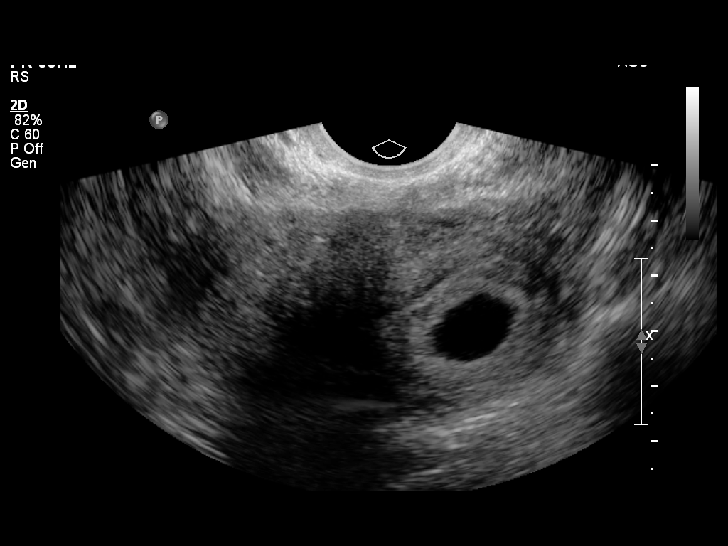
[im 46/65]
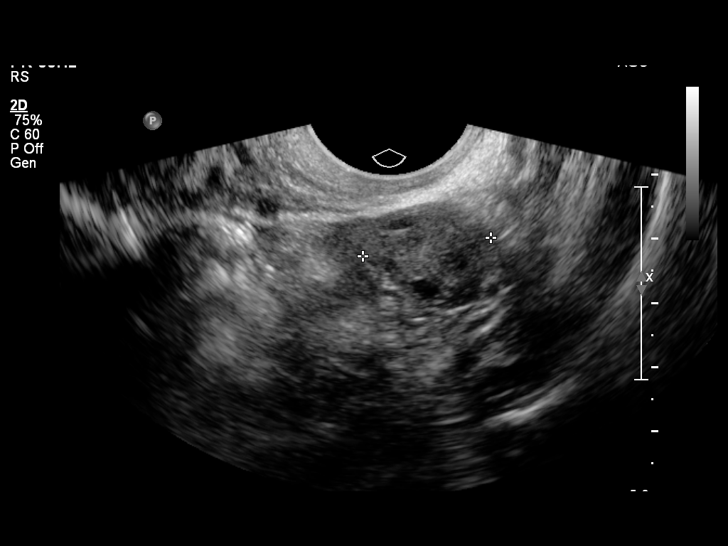
[im 50/65]
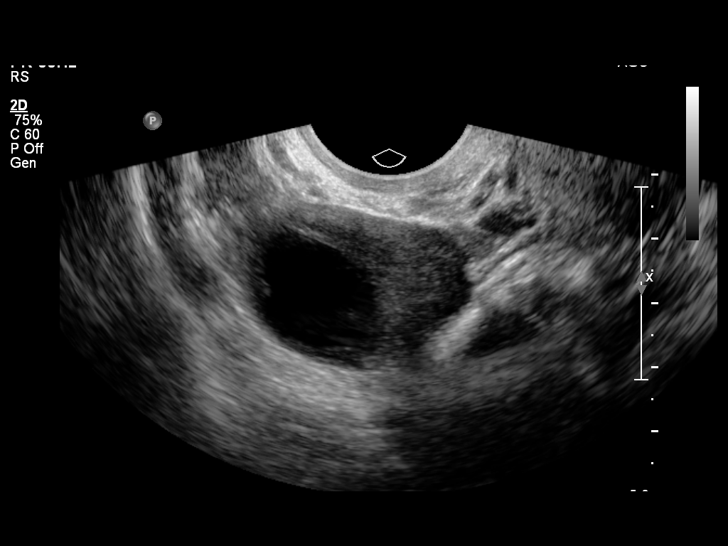
[im 55/65]
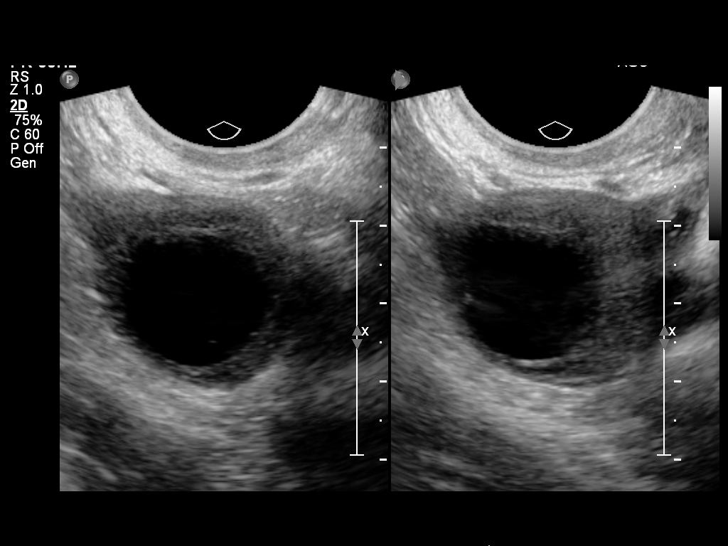
[im 60/65]
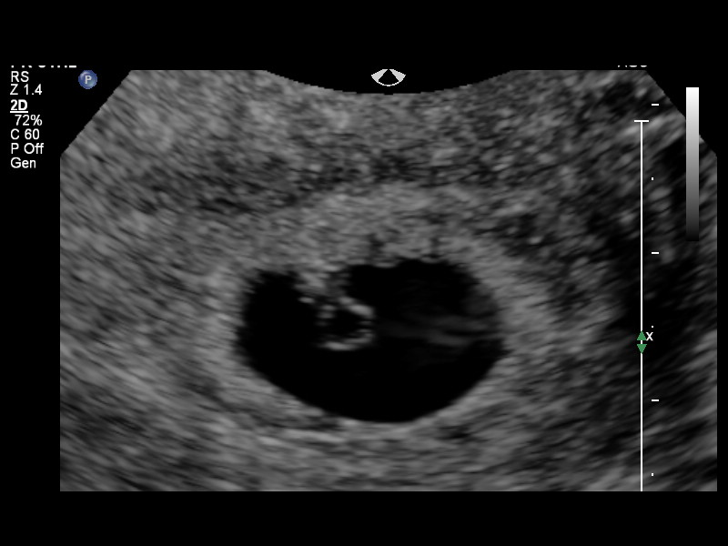
[im 65/65]
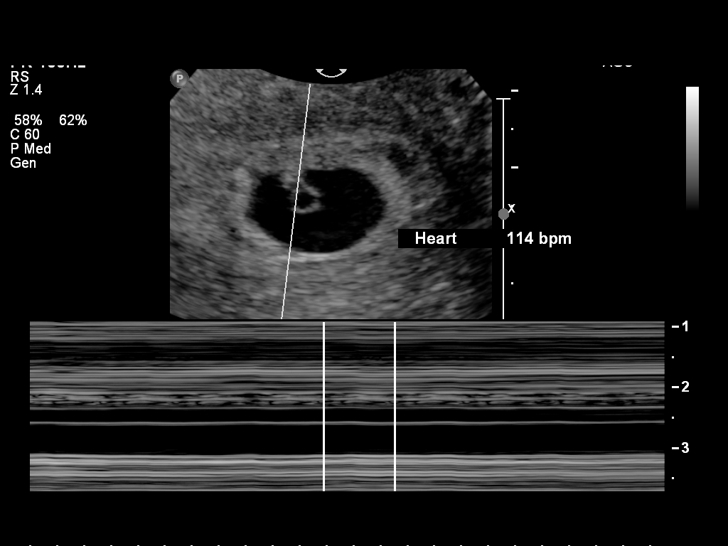

[14 of 28 positions shown; findings below may reference images not displayed]

FINDINGS: Intrauterine gestational sac: Visualized/normal in shape.

Yolk sac:  Yes

Embryo:  Yes

Cardiac Activity: Yes

Heart Rate: 114  bpm

MSD: 13.9  mm   6 w   1  d

CRL:  4.4  mm   6 w   1 d                  US EDC: 06/01/2015

Maternal uterus/adnexae: No uterine masses. Small subchorionic
hemorrhages noted along the margin of the gestational sac. Cervix is
unremarkable. Right ovary contains a dominant 1.9 cm cyst likely a
corpus luteum. Ovaries otherwise unremarkable. No adnexal masses. No
significant pelvic free fluid.
IMPRESSION: 1. Single live intrauterine pregnancy with a measured gestational
age of 6 weeks and 1 day.
2. Small subchorionic hemorrhage.
3. No other abnormalities.

## 2020-11-26 ENCOUNTER — Other Ambulatory Visit: Payer: Self-pay

## 2020-11-26 ENCOUNTER — Ambulatory Visit (INDEPENDENT_AMBULATORY_CARE_PROVIDER_SITE_OTHER): Payer: Medicaid Other

## 2020-11-26 VITALS — BP 120/72 | HR 84 | Wt 269.6 lb

## 2020-11-26 DIAGNOSIS — Z3481 Encounter for supervision of other normal pregnancy, first trimester: Secondary | ICD-10-CM

## 2020-11-26 DIAGNOSIS — O3680X0 Pregnancy with inconclusive fetal viability, not applicable or unspecified: Secondary | ICD-10-CM

## 2020-11-26 DIAGNOSIS — Z3491 Encounter for supervision of normal pregnancy, unspecified, first trimester: Secondary | ICD-10-CM | POA: Insufficient documentation

## 2020-11-26 DIAGNOSIS — Z3A10 10 weeks gestation of pregnancy: Secondary | ICD-10-CM | POA: Diagnosis not present

## 2020-11-26 DIAGNOSIS — Z3483 Encounter for supervision of other normal pregnancy, third trimester: Secondary | ICD-10-CM | POA: Insufficient documentation

## 2020-11-26 MED ORDER — BLOOD PRESSURE KIT DEVI: 1.0000 | 0 refills | Status: AC

## 2020-11-26 NOTE — Progress Notes (Signed)
Agree with nurses's documentation of this patient's clinic encounter.  Anndrea Mihelich L, MD  

## 2020-11-26 NOTE — Progress Notes (Signed)
New OB Intake  I connected with  Derald Macleod on 11/26/20 at  3:15 PM EDT by in person and verified that I am speaking with the correct person using two identifiers. Nurse is located at [facility] and pt is located at [place].  I discussed the limitations, risks, security and privacy concerns of performing an evaluation and management service by telephone and the availability of in person appointments. I also discussed with the patient that there may be a patient responsible charge related to this service. The patient expressed understanding and agreed to proceed.  I explained I am completing New OB Intake today. We discussed her EDD of 06/22/21 that is based on LMP of 09/15/20. Pt is G2/P1001. I reviewed her allergies, medications, Medical/Surgical/OB history, and appropriate screenings. I informed her of [facility] services. Based on history, this is a/an  pregnancy uncomplicated .   Patient Active Problem List   Diagnosis Date Noted   NSVD (normal spontaneous vaginal delivery) 05/30/2015   Indication for care in labor or delivery 05/29/2015   Pre-birth visit for expectant mother 04/29/2015    Concerns addressed today  Delivery Plans:  Plans to deliver at [facility].   MyChart/Babyscripts MyChart access verified. I explained pt will have some visits in office and some virtually. Babyscripts instructions given and order placed. Patient verifies receipt of registration text/e-mail. Account successfully created and app downloaded.  Blood Pressure Cuff  Blood pressure cuff ordered for patient to pick-up from [employer]. Explained after first prenatal appt pt will check weekly and document in Babyscripts.  Weight scale: Patient does have a weight scale at home.  Anatomy US Explained first scheduled [place] will be around 19 weeks. Dating and viability scan performed today.  Labs Discussed Avelina Laine genetic screening with patient. Would like both Panorama and Horizon drawn at new OB visit. Routine  prenatal labs needed.  Covid Vaccine Patient has not covid vaccine.   Informed patient of [facility] website  and placed link in her AVS.   Social Determinants of Health Food Insecurity: Patient denies food insecurity. WIC Referral: Patient is not interested in referral to [facility].  Transportation: Patient denies transportation needs. Childcare: Discussed no children allowed at ultrasound appointments. Offered childcare services; patient declines childcare services at this time.  Send link to Pregnancy Navigators   Placed OB Box on problem list and updated  First visit review I reviewed new OB appt with pt. I explained she will have a pelvic exam, ob bloodwork with genetic screening, and PAP smear. Explained pt will be seen by Nettie Elm at first visit; encounter routed to appropriate provider. Explained that patient will be seen by pregnancy navigator following visit with provider. [facility] information placed in AVS.   Hamilton Capri, RN 11/26/2020  3:17 PM

## 2020-11-27 ENCOUNTER — Inpatient Hospital Stay (HOSPITAL_COMMUNITY)
Admission: AD | Admit: 2020-11-27 | Discharge: 2020-11-27 | Disposition: A | Discharge status: Home / Self Care | Payer: Medicaid Other | Attending: Obstetrics and Gynecology | Admitting: Obstetrics and Gynecology

## 2020-11-27 ENCOUNTER — Encounter (HOSPITAL_COMMUNITY): Payer: Self-pay | Admitting: Obstetrics and Gynecology

## 2020-11-27 DIAGNOSIS — Z3A10 10 weeks gestation of pregnancy: Secondary | ICD-10-CM | POA: Insufficient documentation

## 2020-11-27 DIAGNOSIS — W1830XA Fall on same level, unspecified, initial encounter: Secondary | ICD-10-CM | POA: Diagnosis not present

## 2020-11-27 DIAGNOSIS — R42 Dizziness and giddiness: Secondary | ICD-10-CM | POA: Diagnosis present

## 2020-11-27 DIAGNOSIS — Z3481 Encounter for supervision of other normal pregnancy, first trimester: Secondary | ICD-10-CM

## 2020-11-27 DIAGNOSIS — O26893 Other specified pregnancy related conditions, third trimester: Secondary | ICD-10-CM | POA: Insufficient documentation

## 2020-11-27 LAB — COMPREHENSIVE METABOLIC PANEL
ALT: 12 U/L (ref 0–44)
AST: 23 U/L (ref 15–41)
Albumin: 3.1 g/dL — ABNORMAL LOW (ref 3.5–5.0)
Alkaline Phosphatase: 78 U/L (ref 38–126)
Anion gap: 7 (ref 5–15)
BUN: 7 mg/dL (ref 6–20)
CO2: 23 mmol/L (ref 22–32)
Calcium: 8.9 mg/dL (ref 8.9–10.3)
Chloride: 104 mmol/L (ref 98–111)
Creatinine, Ser: 0.72 mg/dL (ref 0.44–1.00)
GFR, Estimated: >60 mL/min (ref >60)
Glucose, Bld: 89 mg/dL (ref 70–99)
Potassium: 4.5 mmol/L (ref 3.5–5.1)
Sodium: 134 mmol/L — ABNORMAL LOW (ref 135–145)
Total Bilirubin: 0.8 mg/dL (ref 0.3–1.2)
Total Protein: 6.7 g/dL (ref 6.5–8.1)

## 2020-11-27 LAB — URINALYSIS, ROUTINE W REFLEX MICROSCOPIC
Bilirubin Urine: NEGATIVE
Glucose, UA: NEGATIVE mg/dL
Hgb urine dipstick: NEGATIVE
Ketones, ur: NEGATIVE mg/dL
Leukocytes,Ua: NEGATIVE
Nitrite: NEGATIVE
Protein, ur: NEGATIVE mg/dL
Specific Gravity, Urine: 1.021 (ref 1.005–1.030)
pH: 7 (ref 5.0–8.0)

## 2020-11-27 LAB — CBC
HCT: 36.3 % (ref 36.0–46.0)
Hemoglobin: 12.1 g/dL (ref 12.0–15.0)
MCH: 25.8 pg — ABNORMAL LOW (ref 26.0–34.0)
MCHC: 33.3 g/dL (ref 30.0–36.0)
MCV: 77.4 fL — ABNORMAL LOW (ref 80.0–100.0)
Platelets: 275 10*3/uL (ref 150–400)
RBC: 4.69 MIL/uL (ref 3.87–5.11)
RDW: 16.3 % — ABNORMAL HIGH (ref 11.5–15.5)
WBC: 9.4 10*3/uL (ref 4.0–10.5)
nRBC: 0 % (ref 0.0–0.2)

## 2020-11-27 LAB — GLUCOSE, CAPILLARY: Glucose-Capillary: 84 mg/dL (ref 70–99)

## 2020-11-27 MED ORDER — MECLIZINE HCL 12.5 MG PO TABS: 25.0000 mg | ORAL_TABLET | Freq: Two times a day (BID) | ORAL | 0 refills | Status: DC

## 2020-11-27 MED ORDER — MECLIZINE HCL 25 MG PO TABS
25.0000 mg | ORAL_TABLET | Freq: Two times a day (BID) | ORAL | Status: DC
  Filled 2020-11-27 (×2): qty 1

## 2020-11-27 MED ORDER — ONDANSETRON 8 MG PO TBDP: 8.0000 mg | ORAL_TABLET | Freq: Three times a day (TID) | ORAL | 1 refills | Status: DC | PRN

## 2020-11-27 MED ORDER — MECLIZINE HCL 12.5 MG PO TABS: 12.5000 mg | ORAL_TABLET | Freq: Two times a day (BID) | ORAL | Status: DC

## 2020-11-27 MED ORDER — ONDANSETRON 4 MG PO TBDP
8.0000 mg | ORAL_TABLET | Freq: Once | ORAL | Status: AC
  Administered 2020-11-27: 8 mg via ORAL
  Filled 2020-11-27: qty 2

## 2020-11-27 NOTE — MAU Provider Note (Addendum)
Patient Emily Barnes is a 30 y.o. G2P1001  At 59w3dhere with complaints of dizziness and lightheadedness. She denies blurry vision, floating spots. She endorses some side pain that she has had before. She denies chest pain, SOB, fever. She denies vaginal bleeding, contractions, LOF, dysuria, vaginal discharge. She denies muscle weakness, abnormal gait, headache, weakness or numbness.   She had a sausage and cheese mcmuffin this morning at 10 but she threw it up. This was at 10 am.   She reports that she is nauseated but not throwing up; "a lot of dry heaving".   She reports that she has not hit her head recently, she has no history of recent URI.  History     CSN: [id number] Arrival date and time: 11/27/20 1151   Event Date/Time   First Provider Initiated Contact with Patient 11/27/20 1307      Chief Complaint  Patient presents with   Fall   Dizziness   Fall The accident occurred 3 to 6 hours ago. The fall occurred while standing. She fell from a height of 3 to 5 ft. She landed on Hard floor. There was no blood loss. The point of impact was the right elbow. Pertinent negatives include no abdominal pain or vomiting.  Dizziness This is a new problem. The current episode started yesterday. The problem occurs intermittently. Pertinent negatives include no abdominal pain, congestion, coughing or vomiting.  Patient reports that she felt dizzy as soon as she sat up in bed and she felt like she was "pulled back" into the bed. The feeling continued throughout the day. She did not feel safe driving last night.  She reports that she suddenly fell this morning; she did not hit her head or her abdomen.  OB History     Gravida  2   Para  1   Term  1   Preterm  0   AB  0   Living  1      SAB  0   IAB  0   Ectopic  0   Multiple  0   Live Births  1           Past Medical History:  Diagnosis Date   Headache    Medical history non-contributory     Past Surgical  History:  Procedure Laterality Date   CYST EXCISION Left    axilla    Family History  Problem Relation Age of Onset   Diabetes Father    Alcohol abuse Neg Hx    Arthritis Neg Hx    Asthma Neg Hx    Birth defects Neg Hx    Cancer Neg Hx    COPD Neg Hx    Depression Neg Hx    Drug abuse Neg Hx    Early death Neg Hx    Hearing loss Neg Hx    Heart disease Neg Hx    Hyperlipidemia Neg Hx    Hypertension Neg Hx    Kidney disease Neg Hx    Learning disabilities Neg Hx    Mental illness Neg Hx    Mental retardation Neg Hx    Miscarriages / Stillbirths Neg Hx    Stroke Neg Hx    Vision loss Neg Hx    Varicose Veins Neg Hx     Social History   Tobacco Use   Smoking status: Never   Smokeless tobacco: Never  Vaping Use   Vaping Use: Never used  Substance Use Topics  Alcohol use: Not Currently    Comment: occ, but not since confirmed pregnancy   Drug use: Not Currently    Comment: CBD and Hemp prior to pregnancy    Allergies: No Known Allergies  Medications Prior to Admission  Medication Sig Dispense Refill Last Dose   Blood Pressure Monitoring (BLOOD PRESSURE KIT) DEVI 1 kit by Does not apply route once a week. 1 each 0    Prenatal MV & Min w/FA-DHA (PRENATAL GUMMIES PO) Take by mouth.       Review of Systems  HENT:  Negative for congestion.   Respiratory:  Negative for cough.   Gastrointestinal:  Negative for abdominal pain and vomiting.  Neurological:  Positive for dizziness.  Physical Exam   Blood pressure 125/85, pulse (!) 107, temperature 98.6 F (37 C), temperature source Oral, resp. rate 16, height 5' 9"  (1.753 m), weight 121.6 kg, last menstrual period 09/15/2020, SpO2 99 %, unknown if currently breastfeeding.  Physical Exam HENT:     Nose: Nose normal.  Pulmonary:     Effort: Pulmonary effort is normal.  Abdominal:     General: Abdomen is flat.  Musculoskeletal:     Cervical back: Normal range of motion.  Neurological:     Mental Status: She is  alert.    MAU Course  Procedures  MDM -will check CBC-normal -EKG is normal -CBG is 84, no hypoglycemia -CMP is normal Discussed with Dr. Kennon Rounds, who recommends discharge with meclizine and vertigo exercises.  FHT present by Doppler Assessment and Plan    1. Vertigo   2. Encounter for supervision of other normal pregnancy in first trimester    -patient stable for discharge; she had Zofran while in MAU but declined Meclizine.  -discharged home after reviewing in detail different exercises for vertigo, discussed that she needs to start slowly -start with 42m meclizine BID and increase to TID, however, exercises to resolve vertigo are essential to resolution. Recommended that she not drive until she sees how meclizine affects her -keep follow up appt with OB; if she is not improved could consider referral to neurology    KMervyn SkeetersKooistra 11/27/2020, 1:09 PM

## 2020-11-27 NOTE — MAU Note (Signed)
Emily Barnes is a 30 y.o. at [redacted]w[redacted]d here in MAU reporting: has been feeling dizzy since yesterday. Feels like the dizziness got worse last night. This AM when she got up she fell on her side. Having left lower abdominal pain ongoing for a while.   Onset of complaint: yesterday  Pain score: 4/10  Vitals:   11/27/20 1223  BP: 106/81  Pulse: 75  Resp: 16  Temp: 98.6 F (37 C)  SpO2: 99%     Lab orders placed from triage: UA

## 2020-12-15 ENCOUNTER — Encounter: Payer: Self-pay | Admitting: Obstetrics and Gynecology

## 2020-12-15 ENCOUNTER — Other Ambulatory Visit (HOSPITAL_COMMUNITY)
Admission: RE | Admit: 2020-12-15 | Discharge: 2020-12-15 | Disposition: A | Discharge status: Home / Self Care | Payer: Medicaid Other | Source: Ambulatory Visit | Attending: Obstetrics and Gynecology | Admitting: Obstetrics and Gynecology

## 2020-12-15 ENCOUNTER — Ambulatory Visit (INDEPENDENT_AMBULATORY_CARE_PROVIDER_SITE_OTHER): Payer: Medicaid Other | Admitting: Obstetrics and Gynecology

## 2020-12-15 ENCOUNTER — Other Ambulatory Visit: Payer: Self-pay | Admitting: Obstetrics and Gynecology

## 2020-12-15 ENCOUNTER — Other Ambulatory Visit: Payer: Self-pay

## 2020-12-15 VITALS — BP 116/76 | HR 92 | Wt 269.0 lb

## 2020-12-15 DIAGNOSIS — Z23 Encounter for immunization: Secondary | ICD-10-CM

## 2020-12-15 DIAGNOSIS — Z3481 Encounter for supervision of other normal pregnancy, first trimester: Secondary | ICD-10-CM

## 2020-12-15 MED ORDER — MECLIZINE HCL 12.5 MG PO TABS: 25.0000 mg | ORAL_TABLET | Freq: Two times a day (BID) | ORAL | 2 refills | Status: DC

## 2020-12-15 NOTE — Progress Notes (Signed)
Pt presents today for NOB visit. Pt states she is still experiencing vertigo. Needs RF of meclizine until she can get into see neurology. Pt is also experiencing some constipation.

## 2020-12-15 NOTE — Patient Instructions (Signed)

## 2020-12-15 NOTE — Progress Notes (Signed)
Subjective:  Emily Barnes is a 30 y.o. G2P1001 at [redacted]w[redacted]d being seen today for first OB visit. EDD by LMP and confirmed by first trimester U/S. Problems with vertigo this pregnancy. Taking Antivert which helps. O/W denies chronic medical problems or medications. H/O TSVD x 1 without problems. She is currently monitored for the following issues for this low-risk pregnancy and has Encounter for supervision of normal pregnancy in first trimester on their problem list.  Patient reports vertigo.  Contractions: Not present. Vag. Bleeding: None.  Movement: Absent. Denies leaking of fluid.   The following portions of the patient's history were reviewed and updated as appropriate: allergies, current medications, past family history, past medical history, past social history, past surgical history and problem list. Problem list updated.  Objective:   Vitals:   12/15/20 0826  BP: 116/76  Pulse: 92  Weight: 269 lb (122 kg)    Fetal Status:     Movement: Absent     General:  Alert, oriented and cooperative. Patient is in no acute distress.  Skin: Skin is warm and dry. No rash noted.   Cardiovascular: Normal heart rate noted  Respiratory: Normal respiratory effort, no problems with respiration noted  Abdomen: Soft, gravid, appropriate for gestational age. Pain/Pressure: Present     Pelvic:  Cervical exam performed        Extremities: Normal range of motion.  Edema: None  Mental Status: Normal mood and affect. Normal behavior. Normal judgment and thought content.   Urinalysis:      Assessment and Plan:  Pregnancy: G2P1001 at [redacted]w[redacted]d  1. Encounter for supervision of other normal pregnancy in first trimester Prenatal care and labs reviewed with pt Genetic testing discussed Will refill Anitvert. Advised to see PCP for vertigo and further management  - CBC/D/Plt+RPR+Rh+ABO+RubIgG... - Cytology - PAP( [place]) - Cervicovaginal ancillary only( [place]) - Genetic Screening - Culture, OB  Urine - Flu Vaccine QUAD 36+ mos IM (Fluarix, Fluzone & Afluria Quad PF - meclizine (ANTIVERT) 12.5 MG tablet; Take 2 tablets (25 mg total) by mouth 2 (two) times daily. Take two tablets twice a day. Take nausea medicine BEFORE taking this medication. Increase to three times a day if no improvement.  Dispense: 30 tablet; Refill: 2  Preterm labor symptoms and general obstetric precautions including but not limited to vaginal bleeding, contractions, leaking of fluid and fetal movement were reviewed in detail with the patient. Please refer to After Visit Summary for other counseling recommendations.  Return in about 4 weeks (around 01/12/2021) for OB visit, face to face, any provider.   Hermina Staggers, MD

## 2020-12-16 LAB — CBC/D/PLT+RPR+RH+ABO+RUBIGG...
Antibody Screen: NEGATIVE
Basophils Absolute: 0 10*3/uL (ref 0.0–0.2)
Basos: 0 %
EOS (ABSOLUTE): 0.1 10*3/uL (ref 0.0–0.4)
Eos: 1 %
HCV Ab: <0.1 s/co ratio (ref 0.0–0.9)
HIV Screen 4th Generation wRfx: NONREACTIVE
Hematocrit: 38.8 % (ref 34.0–46.6)
Hemoglobin: 12.6 g/dL (ref 11.1–15.9)
Hepatitis B Surface Ag: NEGATIVE
Immature Grans (Abs): 0 10*3/uL (ref 0.0–0.1)
Immature Granulocytes: 0 %
Lymphocytes Absolute: 1.3 10*3/uL (ref 0.7–3.1)
Lymphs: 17 %
MCH: 25.3 pg — ABNORMAL LOW (ref 26.6–33.0)
MCHC: 32.5 g/dL (ref 31.5–35.7)
MCV: 78 fL — ABNORMAL LOW (ref 79–97)
Monocytes Absolute: 0.4 10*3/uL (ref 0.1–0.9)
Monocytes: 5 %
Neutrophils Absolute: 5.8 10*3/uL (ref 1.4–7.0)
Neutrophils: 77 %
Platelets: 284 10*3/uL (ref 150–450)
RBC: 4.98 x10E6/uL (ref 3.77–5.28)
RDW: 15.9 % — ABNORMAL HIGH (ref 11.7–15.4)
RPR Ser Ql: NONREACTIVE
Rh Factor: POSITIVE
Rubella Antibodies, IGG: 3.63 index (ref >0.99)
WBC: 7.6 10*3/uL (ref 3.4–10.8)

## 2020-12-16 LAB — CERVICOVAGINAL ANCILLARY ONLY
Chlamydia: NEGATIVE
Comment: NEGATIVE
Comment: NEGATIVE
Comment: NORMAL
Neisseria Gonorrhea: NEGATIVE
Trichomonas: NEGATIVE

## 2020-12-16 LAB — HCV INTERPRETATION

## 2020-12-17 LAB — CYTOLOGY - PAP: Diagnosis: NEGATIVE

## 2020-12-20 LAB — CULTURE, OB URINE

## 2020-12-20 LAB — URINE CULTURE, OB REFLEX

## 2020-12-21 ENCOUNTER — Encounter: Payer: Self-pay | Admitting: Obstetrics and Gynecology

## 2020-12-21 ENCOUNTER — Other Ambulatory Visit: Payer: Self-pay

## 2020-12-21 DIAGNOSIS — N390 Urinary tract infection, site not specified: Secondary | ICD-10-CM

## 2020-12-21 MED ORDER — NITROFURANTOIN MONOHYD MACRO 100 MG PO CAPS: 100.0000 mg | ORAL_CAPSULE | Freq: Two times a day (BID) | ORAL | 1 refills | Status: DC

## 2020-12-29 ENCOUNTER — Encounter: Payer: Self-pay | Admitting: Obstetrics and Gynecology

## 2021-01-06 ENCOUNTER — Telehealth: Payer: Self-pay | Admitting: *Deleted

## 2021-01-06 NOTE — Telephone Encounter (Signed)
Spoke with pt regarding recent Flu diagnosis.  Pt states she started to have symptoms last week and tested positive on Sunday.  Pt states the office that tested her did not offer treatment due to pregnancy.  Pt ask if she should take Tamiflu. Pt made aware that recommendations from provider would be OTC at this point.  Pt advised of safe OTC meds and advised to stay hydrated.  Pt states understanding.

## 2021-01-12 ENCOUNTER — Ambulatory Visit (INDEPENDENT_AMBULATORY_CARE_PROVIDER_SITE_OTHER): Payer: Medicaid Other | Admitting: Family Medicine

## 2021-01-12 ENCOUNTER — Other Ambulatory Visit: Payer: Self-pay

## 2021-01-12 VITALS — BP 121/75 | HR 102 | Wt 275.0 lb

## 2021-01-12 DIAGNOSIS — Z3A17 17 weeks gestation of pregnancy: Secondary | ICD-10-CM

## 2021-01-12 DIAGNOSIS — Z3481 Encounter for supervision of other normal pregnancy, first trimester: Secondary | ICD-10-CM

## 2021-01-12 NOTE — Progress Notes (Signed)
   Subjective:  Emily Barnes is a 30 y.o. G2P1001 at [redacted]w[redacted]d being seen today for ongoing prenatal care.  She is currently monitored for the following issues for this low-risk pregnancy and has Encounter for supervision of normal pregnancy in first trimester on their problem list.  Patient reports no complaints.  Contractions: Not present. Vag. Bleeding: None.   . Denies leaking of fluid.   The following portions of the patient's history were reviewed and updated as appropriate: allergies, current medications, past family history, past medical history, past social history, past surgical history and problem list. Problem list updated.  Objective:   Vitals:   01/12/21 0837  BP: 121/75  Pulse: (!) 102  Weight: 275 lb (124.7 kg)    Fetal Status: Fetal Heart Rate (bpm): 150 Fundal Height: 16 cm       General:  Alert, oriented and cooperative. Patient is in no acute distress.  Skin: Skin is warm and dry. No rash noted.   Respiratory: Normal respiratory effort, no problems with respiration noted  Abdomen: Soft, gravid, appropriate for gestational age. Pain/Pressure: Present     Pelvic: Vag. Bleeding: None     Cervical exam deferred        Extremities: Normal range of motion.  Edema: None  Mental Status: Normal mood and affect. Normal behavior. Normal judgment and thought content.    Assessment and Plan:  Pregnancy: G2P1001 at [redacted]w[redacted]d  1. Encounter for supervision of other normal pregnancy in first trimester 2. [redacted] weeks gestation of pregnancy Doing well. No acute concerns today. Appropriate heart tones. Getting AFP today. Anatomy US ordered and patient to schedule. - [place] MFM OB DETAIL +14 WK; Future - AFP, Serum, Open Spina Bifida - follow up in 4 weeks - continue PNV  Preterm labor symptoms and general obstetric precautions including but not limited to vaginal bleeding, contractions, leaking of fluid and fetal movement were reviewed in detail with the patient. Please refer to After  Visit Summary for other counseling recommendations.  Return in about 4 weeks (around 02/09/2021) for LROB, any provider, also schedule for Anatomy US.   Warner Mccreedy, MD, MPH OB Fellow, Faculty Practice

## 2021-01-14 LAB — AFP, SERUM, OPEN SPINA BIFIDA
AFP MoM: 1.28
AFP Value: 36.1 ng/mL
Gest. Age on Collection Date: 17 weeks
Maternal Age At EDD: 28.6 yr
OSBR Risk 1 IN: 5096
Test Results:: NEGATIVE
Weight: 275 [lb_av]

## 2021-01-28 ENCOUNTER — Ambulatory Visit: Payer: Medicaid Other

## 2021-01-28 ENCOUNTER — Other Ambulatory Visit: Payer: Medicaid Other

## 2021-02-04 ENCOUNTER — Other Ambulatory Visit: Payer: Self-pay

## 2021-02-04 ENCOUNTER — Other Ambulatory Visit: Payer: Self-pay | Admitting: *Deleted

## 2021-02-04 ENCOUNTER — Ambulatory Visit: Payer: Medicaid Other | Admitting: *Deleted

## 2021-02-04 ENCOUNTER — Encounter: Payer: Self-pay | Admitting: *Deleted

## 2021-02-04 ENCOUNTER — Ambulatory Visit: Payer: Medicaid Other | Attending: Family Medicine

## 2021-02-04 VITALS — BP 117/65 | HR 81

## 2021-02-04 DIAGNOSIS — O99212 Obesity complicating pregnancy, second trimester: Secondary | ICD-10-CM | POA: Insufficient documentation

## 2021-02-04 DIAGNOSIS — Z3481 Encounter for supervision of other normal pregnancy, first trimester: Secondary | ICD-10-CM | POA: Diagnosis present

## 2021-02-04 DIAGNOSIS — Z363 Encounter for antenatal screening for malformations: Secondary | ICD-10-CM | POA: Diagnosis not present

## 2021-02-04 DIAGNOSIS — Z3A17 17 weeks gestation of pregnancy: Secondary | ICD-10-CM | POA: Diagnosis not present

## 2021-02-04 DIAGNOSIS — Z3A20 20 weeks gestation of pregnancy: Secondary | ICD-10-CM | POA: Diagnosis not present

## 2021-02-04 DIAGNOSIS — Z6839 Body mass index (BMI) 39.0-39.9, adult: Secondary | ICD-10-CM

## 2021-02-10 ENCOUNTER — Ambulatory Visit (INDEPENDENT_AMBULATORY_CARE_PROVIDER_SITE_OTHER): Payer: Medicaid Other | Admitting: Nurse Practitioner

## 2021-02-10 ENCOUNTER — Other Ambulatory Visit: Payer: Self-pay

## 2021-02-10 VITALS — BP 123/74 | HR 89 | Wt 284.0 lb

## 2021-02-10 DIAGNOSIS — Z3A21 21 weeks gestation of pregnancy: Secondary | ICD-10-CM

## 2021-02-10 DIAGNOSIS — Z3482 Encounter for supervision of other normal pregnancy, second trimester: Secondary | ICD-10-CM

## 2021-02-10 NOTE — Progress Notes (Signed)
+   fetal movement. No complaints.  

## 2021-02-10 NOTE — Progress Notes (Signed)
° ° °  Subjective:  Emily Barnes is a 30 y.o. G2P1001 at [redacted]w[redacted]d being seen today for ongoing prenatal care.  She is currently monitored for the following issues for this low-risk pregnancy and has Encounter for supervision of normal pregnancy in first trimester on their problem list.  Patient reports no complaints.  Contractions: Not present. Vag. Bleeding: None.  Movement: Present. Denies leaking of fluid.   The following portions of the patient's history were reviewed and updated as appropriate: allergies, current medications, past family history, past medical history, past social history, past surgical history and problem list. Problem list updated.  Objective:   Vitals:   02/10/21 0835  BP: 123/74  Pulse: 89  Weight: 284 lb (128.8 kg)    Fetal Status: Fetal Heart Rate (bpm): 150   Movement: Present     General:  Alert, oriented and cooperative. Patient is in no acute distress.  Skin: Skin is warm and dry. No rash noted.   Cardiovascular: Normal heart rate noted  Respiratory: Normal respiratory effort, no problems with respiration noted  Abdomen: Soft, gravid, appropriate for gestational age. Pain/Pressure: Present     Pelvic:  Cervical exam deferred        Extremities: Normal range of motion.  Edema: Trace  Mental Status: Normal mood and affect. Normal behavior. Normal judgment and thought content.   Urinalysis:      Assessment and Plan:  Pregnancy: G2P1001 at [redacted]w[redacted]d  1. Encounter for supervision of other normal pregnancy in second trimester Doing well - has anterior placenta and is not feeling the baby move as much as she had expected - reviewed this is normal Has [place] scheduled 04-29-21 Reviewed upright positioning for labor - had epidural for labor before and is wondering if she will need it this time. After patient left,noticed she had UTI treated in early November - will need TOC urine culture on return.  2. [redacted] weeks gestation of pregnancy   Preterm labor symptoms and  general obstetric precautions including but not limited to vaginal bleeding, contractions, leaking of fluid and fetal movement were reviewed in detail with the patient. Please refer to After Visit Summary for other counseling recommendations.  Return in about 4 weeks (around 03/10/2021) for in person ROB.  Nolene Bernheim, RN, MSN, NP-BC Nurse Practitioner, [facility] for [employer], [facility] 02/10/2021 12:14 PM

## 2021-02-14 NOTE — L&D Delivery Note (Addendum)
OB/GYN Faculty Practice Delivery Note ? ?Emily Barnes is a 31 y.o. G2P1001 s/p SVD at [redacted]w[redacted]d. She was admitted for eIOL.  ? ?ROM: 3h 65m with clear fluid ?GBS Status: Negative  ?Maximum Maternal Temperature: 98.3 ? ?Labor Progress: ?Patient came in at 3cm dilated. She receieved 2 doses of Cytotec, foley balloon placement and Pitocin. She eventually progressed to complete. ? ?Delivery Date/Time: 06/21/21 at 0042 ? ?Delivery: Called to room and patient was complete and pushing. Head delivered ROA. Nuchal cord present x1 which was reduced. Shoulder and body delivered in usual fashion. Infant with spontaneous cry, placed on mother's abdomen, dried and stimulated. Cord clamped x 2 after 1-minute delay, and cut by FOB under my direct supervision. Cord blood drawn. Placenta delivered spontaneously with gentle cord traction. Fundus firm with massage and Pitocin. Labia, perineum, vagina, and cervix were inspected and without laceration.  ? ?Placenta: intact, three vessel cord. Appeared to have large clot on fetal surface sent to pathology ?Complications: None ?Lacerations: None ?EBL: 50 mL ?Analgesia: Epidural ? ? ?Infant: female  APGARs 9 and 9   weight pending ? ?Jerre Simon, MD ?Center for [facility], [facility]  ? ? ? ? GME ATTESTATION:  ?I saw and evaluated the patient. I agree with the findings and the plan of care as documented in the resident?s note. I was gloved for entire delivery and directly supervised Dr. Elliot Gurney. ? ?Warner Mccreedy, MD, MPH ?OB Fellow, Faculty Practice ?[place], Center for [facility] ?06/21/2021 5:11 AM ? ?

## 2021-03-10 ENCOUNTER — Other Ambulatory Visit: Payer: Self-pay

## 2021-03-10 ENCOUNTER — Ambulatory Visit (INDEPENDENT_AMBULATORY_CARE_PROVIDER_SITE_OTHER): Payer: Medicaid Other | Admitting: Nurse Practitioner

## 2021-03-10 VITALS — BP 118/70 | HR 94 | Wt 286.8 lb

## 2021-03-10 DIAGNOSIS — Z3A25 25 weeks gestation of pregnancy: Secondary | ICD-10-CM

## 2021-03-10 DIAGNOSIS — Z3482 Encounter for supervision of other normal pregnancy, second trimester: Secondary | ICD-10-CM

## 2021-03-10 DIAGNOSIS — Z6839 Body mass index (BMI) 39.0-39.9, adult: Secondary | ICD-10-CM

## 2021-03-10 DIAGNOSIS — Z3481 Encounter for supervision of other normal pregnancy, first trimester: Secondary | ICD-10-CM

## 2021-03-10 NOTE — Progress Notes (Signed)
° ° °  Subjective:  Emily Barnes is a 31 y.o. G2P1001 at [redacted]w[redacted]d being seen today for ongoing prenatal care.  She is currently monitored for the following issues for this low-risk pregnancy and has Encounter for supervision of normal pregnancy in first trimester on their problem list.  Patient reports no complaints.  Contractions: Not present. Vag. Bleeding: None.  Movement: Present. Denies leaking of fluid.   The following portions of the patient's history were reviewed and updated as appropriate: allergies, current medications, past family history, past medical history, past social history, past surgical history and problem list. Problem list updated.  Objective:   Vitals:   03/10/21 0840  BP: 118/70  Pulse: 94  Weight: 286 lb 12.8 oz (130.1 kg)    Fetal Status: Fetal Heart Rate (bpm): 143 Fundal Height: 29 cm Movement: Present     General:  Alert, oriented and cooperative. Patient is in no acute distress.  Skin: Skin is warm and dry. No rash noted.   Cardiovascular: Normal heart rate noted  Respiratory: Normal respiratory effort, no problems with respiration noted  Abdomen: Soft, gravid, appropriate for gestational age. Pain/Pressure: Present     Pelvic:  Cervical exam deferred        Extremities: Normal range of motion.  Edema: Trace  Mental Status: Normal mood and affect. Normal behavior. Normal judgment and thought content.   Urinalysis:      Assessment and Plan:  Pregnancy: G2P1001 at [redacted]w[redacted]d  1. Encounter for supervision of other normal pregnancy in first trimester Prepregnancy BMI 39 Measures > dates but likely related to BMI - is scheduled for another [place] in March Is feeling more movement now. Reviewed BP readings in [employer] - all normal range  2. [redacted] weeks gestation of pregnancy   Preterm labor symptoms and general obstetric precautions including but not limited to vaginal bleeding, contractions, leaking of fluid and fetal movement were reviewed in detail with the  patient. Please refer to After Visit Summary for other counseling recommendations.  Return in about 3 weeks (around 03/31/2021) for early AM appt for fasting glucola and ROB.  Nolene Bernheim, RN, MSN, NP-BC Nurse Practitioner, [facility] for [employer], [facility] 03/10/2021 8:55 AM

## 2021-03-10 NOTE — Progress Notes (Signed)
Pt reports fetal movement with some pressure. 

## 2021-03-31 ENCOUNTER — Encounter: Payer: Self-pay | Admitting: Obstetrics and Gynecology

## 2021-03-31 ENCOUNTER — Ambulatory Visit (INDEPENDENT_AMBULATORY_CARE_PROVIDER_SITE_OTHER): Payer: Medicaid Other | Admitting: Obstetrics and Gynecology

## 2021-03-31 ENCOUNTER — Other Ambulatory Visit: Payer: Medicaid Other

## 2021-03-31 ENCOUNTER — Other Ambulatory Visit: Payer: Self-pay

## 2021-03-31 DIAGNOSIS — Z23 Encounter for immunization: Secondary | ICD-10-CM | POA: Diagnosis not present

## 2021-03-31 DIAGNOSIS — Z3481 Encounter for supervision of other normal pregnancy, first trimester: Secondary | ICD-10-CM | POA: Diagnosis not present

## 2021-03-31 MED ORDER — CYCLOBENZAPRINE HCL 10 MG PO TABS
10.0000 mg | ORAL_TABLET | Freq: Three times a day (TID) | ORAL | 2 refills | Status: DC | PRN
Start: 2021-03-31 — End: 2021-06-22

## 2021-03-31 NOTE — Progress Notes (Signed)
Pt complains of low back and hip pain- rates 9 out of 10.  Has taken Tylenol with little to no relief.  PHQ2 score 1 today.

## 2021-03-31 NOTE — Patient Instructions (Signed)

## 2021-03-31 NOTE — Progress Notes (Signed)
Subjective:  Emily Barnes is a 31 y.o. G2P1001 at [redacted]w[redacted]d being seen today for ongoing prenatal care.  She is currently monitored for the following issues for this low-risk pregnancy and has Encounter for supervision of normal pregnancy in first trimester and BMI 39.0-39.9,adult on their problem list.  Patient reports backache.  Contractions: Not present. Vag. Bleeding: None.  Movement: Present. Denies leaking of fluid.   The following portions of the patient's history were reviewed and updated as appropriate: allergies, current medications, past family history, past medical history, past social history, past surgical history and problem list. Problem list updated.  Objective:   Vitals:   03/31/21 0840  BP: 112/73  Pulse: 94  Weight: 289 lb (131.1 kg)    Fetal Status:     Movement: Present     General:  Alert, oriented and cooperative. Patient is in no acute distress.  Skin: Skin is warm and dry. No rash noted.   Cardiovascular: Normal heart rate noted  Respiratory: Normal respiratory effort, no problems with respiration noted  Abdomen: Soft, gravid, appropriate for gestational age. Pain/Pressure: Present     Pelvic:  Cervical exam deferred        Extremities: Normal range of motion.     Mental Status: Normal mood and affect. Normal behavior. Normal judgment and thought content.   Urinalysis:      Assessment and Plan:  Pregnancy: G2P1001 at [redacted]w[redacted]d  1. Encounter for supervision of other normal pregnancy in first trimester Stable Tx options for backache reviewed - Glucose Tolerance, 2 Hours w/1 Hour - CBC - HIV Antibody (routine testing w rflx) - RPR - Tdap vaccine greater than or equal to 7yo IM - Urine Culture  Preterm labor symptoms and general obstetric precautions including but not limited to vaginal bleeding, contractions, leaking of fluid and fetal movement were reviewed in detail with the patient. Please refer to After Visit Summary for other counseling  recommendations.  Return in about 2 weeks (around 04/14/2021) for OB visit, face to face, any provider.   Hermina Staggers, MD

## 2021-04-01 LAB — GLUCOSE TOLERANCE, 2 HOURS W/ 1HR
Glucose, 1 hour: 141 mg/dL (ref 70–179)
Glucose, 2 hour: 138 mg/dL (ref 70–152)
Glucose, Fasting: 83 mg/dL (ref 70–91)

## 2021-04-01 LAB — RPR: RPR Ser Ql: NONREACTIVE

## 2021-04-01 LAB — CBC
Hematocrit: 34.5 % (ref 34.0–46.6)
Hemoglobin: 11.1 g/dL (ref 11.1–15.9)
MCH: 24.9 pg — ABNORMAL LOW (ref 26.6–33.0)
MCHC: 32.2 g/dL (ref 31.5–35.7)
MCV: 77 fL — ABNORMAL LOW (ref 79–97)
Platelets: 292 10*3/uL (ref 150–450)
RBC: 4.46 x10E6/uL (ref 3.77–5.28)
RDW: 13.6 % (ref 11.7–15.4)
WBC: 10 10*3/uL (ref 3.4–10.8)

## 2021-04-01 LAB — HIV ANTIBODY (ROUTINE TESTING W REFLEX): HIV Screen 4th Generation wRfx: NONREACTIVE

## 2021-04-03 LAB — URINE CULTURE

## 2021-04-14 ENCOUNTER — Other Ambulatory Visit: Payer: Self-pay

## 2021-04-14 ENCOUNTER — Ambulatory Visit (INDEPENDENT_AMBULATORY_CARE_PROVIDER_SITE_OTHER): Payer: Medicaid Other | Admitting: Obstetrics and Gynecology

## 2021-04-14 ENCOUNTER — Encounter: Payer: Self-pay | Admitting: Obstetrics and Gynecology

## 2021-04-14 DIAGNOSIS — Z3481 Encounter for supervision of other normal pregnancy, first trimester: Secondary | ICD-10-CM

## 2021-04-14 NOTE — Patient Instructions (Signed)

## 2021-04-14 NOTE — Progress Notes (Signed)
Subjective:  ?Emily Barnes is a 31 y.o. G2P1001 at [redacted]w[redacted]d being seen today for ongoing prenatal care.  She is currently monitored for the following issues for this low-risk pregnancy and has Encounter for supervision of normal pregnancy in first trimester and BMI 39.0-39.9,adult on their problem list. ? ?Patient reports general discomforts of pregnancy.  Contractions: Not present. Vag. Bleeding: None.  Movement: Present. Denies leaking of fluid.  ? ?The following portions of the patient's history were reviewed and updated as appropriate: allergies, current medications, past family history, past medical history, past social history, past surgical history and problem list. Problem list updated. ? ?Objective:  ? ?Vitals:  ? 04/14/21 0900  ?BP: 113/76  ?Pulse: (!) 109  ?Weight: 291 lb (132 kg)  ? ? ?Fetal Status: Fetal Heart Rate (bpm): 140   Movement: Present    ? ?General:  Alert, oriented and cooperative. Patient is in no acute distress.  ?Skin: Skin is warm and dry. No rash noted.   ?Cardiovascular: Normal heart rate noted  ?Respiratory: Normal respiratory effort, no problems with respiration noted  ?Abdomen: Soft, gravid, appropriate for gestational age. Pain/Pressure: Present     ?Pelvic:  Cervical exam deferred        ?Extremities: Normal range of motion.  Edema: None  ?Mental Status: Normal mood and affect. Normal behavior. Normal judgment and thought content.  ? ?Urinalysis:     ? ?Assessment and Plan:  ?Pregnancy: G2P1001 at [redacted]w[redacted]d ? ?1. Encounter for supervision of other normal pregnancy in first trimester ?Stable ?Growth scan 04/29/21 ? ?Preterm labor symptoms and general obstetric precautions including but not limited to vaginal bleeding, contractions, leaking of fluid and fetal movement were reviewed in detail with the patient. ?Please refer to After Visit Summary for other counseling recommendations.  ?Return in about 2 weeks (around 04/28/2021) for OB visit, face to face, any provider. ? ? ?Hermina Staggers, MD ?

## 2021-04-28 ENCOUNTER — Ambulatory Visit (INDEPENDENT_AMBULATORY_CARE_PROVIDER_SITE_OTHER): Payer: BC Managed Care – PPO | Admitting: Obstetrics and Gynecology

## 2021-04-28 ENCOUNTER — Other Ambulatory Visit: Payer: Self-pay

## 2021-04-28 VITALS — BP 106/71 | HR 111 | Wt 288.1 lb

## 2021-04-28 DIAGNOSIS — Z3A32 32 weeks gestation of pregnancy: Secondary | ICD-10-CM

## 2021-04-28 DIAGNOSIS — Z6841 Body Mass Index (BMI) 40.0 and over, adult: Secondary | ICD-10-CM | POA: Insufficient documentation

## 2021-04-28 DIAGNOSIS — Z3483 Encounter for supervision of other normal pregnancy, third trimester: Secondary | ICD-10-CM

## 2021-04-28 NOTE — Progress Notes (Signed)
Pt reports fetal movement with some pressure. 

## 2021-04-28 NOTE — Progress Notes (Signed)
? ?  PRENATAL VISIT NOTE ? ?Subjective:  ?Emily Barnes is a 31 y.o. G2P1001 at 110w1d being seen today for ongoing prenatal care.  She is currently monitored for the following issues for this low-risk pregnancy and has Encounter for supervision of other normal pregnancy, third trimester and BMI 40.0-44.9, adult ([place]) on their problem list. ? ?Patient doing well with no acute concerns today. She reports  one time incident of coughing up bloody phlegm without recurrence .  Contractions: Not present. Vag. Bleeding: None.  Movement: Present. Denies leaking of fluid.  ? ?The following portions of the patient's history were reviewed and updated as appropriate: allergies, current medications, past family history, past medical history, past social history, past surgical history and problem list. Problem list updated. ? ?Objective:  ? ?Vitals:  ? 04/28/21 0841  ?BP: 106/71  ?Pulse: (!) 111  ?Weight: 288 lb 1.6 oz (130.7 kg)  ? ? ?Fetal Status: Fetal Heart Rate (bpm): 142   Movement: Present    ? ?General:  Alert, oriented and cooperative. Patient is in no acute distress.  ?Skin: Skin is warm and dry. No rash noted.   ?Cardiovascular: Normal heart rate noted  ?Respiratory: Normal respiratory effort, no problems with respiration noted  ?Abdomen: Soft, gravid, appropriate for gestational age.  Pain/Pressure: Present     ?Pelvic: Cervical exam deferred        ?Extremities: Normal range of motion.  Edema: Trace  ?Mental Status:  Normal mood and affect. Normal behavior. Normal judgment and thought content.  ? ?Assessment and Plan:  ?Pregnancy: G2P1001 at [redacted]w[redacted]d ? ?1. [redacted] weeks gestation of pregnancy ? ? ?2. Encounter for supervision of other normal pregnancy, third trimester ?Continue routine care ? ?3. BMI 40.0-44.9, adult ([place]) ?Pt has growth scan tomorrow ? ?Preterm labor symptoms and general obstetric precautions including but not limited to vaginal bleeding, contractions, leaking of fluid and fetal movement were reviewed in  detail with the patient. ? ?Please refer to After Visit Summary for other counseling recommendations.  ? ?Return in about 2 weeks (around 05/12/2021) for ROB, in person. ? ? ?Lynnda Shields, MD ?Faculty Attending ?Center for [place] ?  ?

## 2021-04-29 ENCOUNTER — Encounter: Payer: Self-pay | Admitting: *Deleted

## 2021-04-29 ENCOUNTER — Ambulatory Visit: Payer: BC Managed Care – PPO | Admitting: *Deleted

## 2021-04-29 ENCOUNTER — Ambulatory Visit: Payer: BC Managed Care – PPO | Attending: Obstetrics and Gynecology

## 2021-04-29 VITALS — BP 119/70 | HR 107

## 2021-04-29 DIAGNOSIS — Z6839 Body mass index (BMI) 39.0-39.9, adult: Secondary | ICD-10-CM | POA: Insufficient documentation

## 2021-04-29 DIAGNOSIS — Z3A32 32 weeks gestation of pregnancy: Secondary | ICD-10-CM | POA: Diagnosis not present

## 2021-04-29 DIAGNOSIS — O99213 Obesity complicating pregnancy, third trimester: Secondary | ICD-10-CM | POA: Diagnosis not present

## 2021-04-29 DIAGNOSIS — O99212 Obesity complicating pregnancy, second trimester: Secondary | ICD-10-CM | POA: Diagnosis not present

## 2021-05-12 ENCOUNTER — Other Ambulatory Visit: Payer: Self-pay

## 2021-05-12 ENCOUNTER — Ambulatory Visit (INDEPENDENT_AMBULATORY_CARE_PROVIDER_SITE_OTHER): Payer: BC Managed Care – PPO | Admitting: Family Medicine

## 2021-05-12 VITALS — BP 124/81 | HR 116 | Wt 293.8 lb

## 2021-05-12 DIAGNOSIS — Z3A34 34 weeks gestation of pregnancy: Secondary | ICD-10-CM

## 2021-05-12 DIAGNOSIS — G44229 Chronic tension-type headache, not intractable: Secondary | ICD-10-CM

## 2021-05-12 DIAGNOSIS — Z6841 Body Mass Index (BMI) 40.0 and over, adult: Secondary | ICD-10-CM

## 2021-05-12 DIAGNOSIS — Z3483 Encounter for supervision of other normal pregnancy, third trimester: Secondary | ICD-10-CM

## 2021-05-12 NOTE — Progress Notes (Signed)
? ? ?  Subjective:  ?Emily Barnes is a 31 y.o. G2P1001 at [redacted]w[redacted]d being seen today for ongoing prenatal care.  She is currently monitored for the following issues for this low-risk pregnancy and has Encounter for supervision of other normal pregnancy, third trimester and BMI 40.0-44.9, adult ([facility]) on their problem list. ? ?Patient reports  occasional headaches. Pounding in nature, will resolve with tylenol or rest. However she is worried about taking tylenol due to concern of autism association . Photophobia with it, but no lightheadedness/dizziness, weakness/numbness. Contractions: Irritability. Vag. Bleeding: None.  Movement: Present. Denies leaking of fluid.  ? ?The following portions of the patient's history were reviewed and updated as appropriate: allergies, current medications, past family history, past medical history, past social history, past surgical history and problem list.  ? ?Objective:  ? ?Vitals:  ? 05/12/21 0849  ?BP: 124/81  ?Pulse: (!) 116  ?Weight: 293 lb 12.8 oz (133.3 kg)  ? ? ?Fetal Status: Fetal Heart Rate (bpm): 150 Fundal Height: 34 cm Movement: Present    ? ?General:  Alert, oriented and cooperative. Patient is in no acute distress.  ?Skin: Skin is warm and dry. No rash noted.   ?Cardiovascular: Normal heart rate noted  ?Respiratory: Normal respiratory effort, no problems with respiration noted  ?Abdomen: Soft, gravid, appropriate for gestational age. Pain/Pressure: Absent     ?Pelvic:  Cervical exam deferred        ?Extremities: Normal range of motion.  Edema: None  ?Mental Status: Normal mood and affect. Normal behavior. Normal judgment and thought content.  ? ? ?Assessment and Plan:  ?Pregnancy: G2P1001 at [redacted]w[redacted]d ? ?1. Encounter for supervision of other normal pregnancy, third trimester ?Doing well.  ? ?2. [redacted] weeks gestation of pregnancy ?Aware of swabs next visit.  ? ?3. BMI 40.0-44.9, adult (HCC) ?Growth [place] on 3/18 with EFW 81%, planned for repeat in 4 weeks with weekly testing at 34  weeks. Do not see any scheduled thus far--will send a message to get this for her.  ? ?4. Chronic tension-type headache, not intractable ?Neurologically intact. Discussed adequate hydration/meals, massage, sleep, and cool/warm wash cloth on forehead. Tylenol when needed intermittently is safe in pregnancy, discussed the review of tylenol currently for autism is in cases of prolonged use in high doses.  ? ?Preterm labor symptoms and general obstetric precautions including but not limited to vaginal bleeding, contractions, leaking of fluid and fetal movement were reviewed in detail with the patient. ?Please refer to After Visit Summary for other counseling recommendations.  ?No follow-ups on file. ? ? ?Allayne Stack, DO ?

## 2021-05-12 NOTE — Progress Notes (Signed)
Patient presents for ROB. Patient has been having occasional headaches which she sometimes takes tylenol for. She states that she doe snot take tylenol often due to concerns. No other issues. ?

## 2021-05-13 ENCOUNTER — Other Ambulatory Visit: Payer: Self-pay

## 2021-05-13 DIAGNOSIS — Z6841 Body Mass Index (BMI) 40.0 and over, adult: Secondary | ICD-10-CM

## 2021-05-13 DIAGNOSIS — Z3689 Encounter for other specified antenatal screening: Secondary | ICD-10-CM

## 2021-05-26 ENCOUNTER — Other Ambulatory Visit (HOSPITAL_COMMUNITY)
Admission: RE | Admit: 2021-05-26 | Discharge: 2021-05-26 | Disposition: A | Discharge status: Home / Self Care | Payer: BC Managed Care – PPO | Source: Ambulatory Visit | Attending: Obstetrics | Admitting: Obstetrics

## 2021-05-26 ENCOUNTER — Ambulatory Visit (INDEPENDENT_AMBULATORY_CARE_PROVIDER_SITE_OTHER): Payer: BC Managed Care – PPO | Admitting: Obstetrics

## 2021-05-26 ENCOUNTER — Encounter: Payer: Self-pay | Admitting: Obstetrics

## 2021-05-26 VITALS — BP 111/77 | HR 106 | Wt 296.0 lb

## 2021-05-26 DIAGNOSIS — N760 Acute vaginitis: Secondary | ICD-10-CM | POA: Insufficient documentation

## 2021-05-26 DIAGNOSIS — Z3483 Encounter for supervision of other normal pregnancy, third trimester: Secondary | ICD-10-CM | POA: Insufficient documentation

## 2021-05-26 NOTE — Progress Notes (Signed)
ROB GBS 

## 2021-05-26 NOTE — Progress Notes (Signed)
Subjective:  ?Emily Barnes is a 31 y.o. G2P1001 at [redacted]w[redacted]d being seen today for ongoing prenatal care.  She is currently monitored for the following issues for this low-risk pregnancy and has Encounter for supervision of other normal pregnancy, third trimester and BMI 40.0-44.9, adult (HCC) on their problem list. ? ?Patient reports no complaints.  Contractions: Irritability. Vag. Bleeding: None.  Movement: Present. Denies leaking of fluid.  ? ?The following portions of the patient's history were reviewed and updated as appropriate: allergies, current medications, past family history, past medical history, past social history, past surgical history and problem list. Problem list updated. ? ?Objective:  ? ?Vitals:  ? 05/26/21 0909  ?BP: 111/77  ?Pulse: (!) 106  ?Weight: 296 lb (134.3 kg)  ? ? ?Fetal Status: Fetal Heart Rate (bpm): 151   Movement: Present    ? ?General:  Alert, oriented and cooperative. Patient is in no acute distress.  ?Skin: Skin is warm and dry. No rash noted.   ?Cardiovascular: Normal heart rate noted  ?Respiratory: Normal respiratory effort, no problems with respiration noted  ?Abdomen: Soft, gravid, appropriate for gestational age. Pain/Pressure: Present     ?Pelvic:  Cervical exam deferred        ?Extremities: Normal range of motion.  Edema: None  ?Mental Status: Normal mood and affect. Normal behavior. Normal judgment and thought content.  ? ?Urinalysis:     ? ?Assessment and Plan:  ?Pregnancy: G2P1001 at [redacted]w[redacted]d ? ?1. Encounter for supervision of other normal pregnancy, third trimester ?Rx: ?- Culture, beta strep (group b only) ?- Cervicovaginal ancillary only( [place]) ? ?Preterm labor symptoms and general obstetric precautions including but not limited to vaginal bleeding, contractions, leaking of fluid and fetal movement were reviewed in detail with the patient. ?Please refer to After Visit Summary for other counseling recommendations.  ? ?Return in about 1 week (around 06/02/2021) for  ROB. ? ? ?Brock Bad, MD  ?05/26/21  ?

## 2021-05-27 ENCOUNTER — Ambulatory Visit: Payer: BC Managed Care – PPO | Admitting: *Deleted

## 2021-05-27 ENCOUNTER — Other Ambulatory Visit: Payer: Self-pay | Admitting: Family Medicine

## 2021-05-27 ENCOUNTER — Ambulatory Visit: Payer: BC Managed Care – PPO | Attending: Family Medicine

## 2021-05-27 VITALS — BP 107/77 | HR 127

## 2021-05-27 DIAGNOSIS — Z6841 Body Mass Index (BMI) 40.0 and over, adult: Secondary | ICD-10-CM

## 2021-05-27 DIAGNOSIS — O99213 Obesity complicating pregnancy, third trimester: Secondary | ICD-10-CM | POA: Diagnosis not present

## 2021-05-27 DIAGNOSIS — Z3689 Encounter for other specified antenatal screening: Secondary | ICD-10-CM

## 2021-05-27 DIAGNOSIS — Z3483 Encounter for supervision of other normal pregnancy, third trimester: Secondary | ICD-10-CM

## 2021-05-27 DIAGNOSIS — E669 Obesity, unspecified: Secondary | ICD-10-CM | POA: Diagnosis not present

## 2021-05-27 DIAGNOSIS — Z3A36 36 weeks gestation of pregnancy: Secondary | ICD-10-CM | POA: Insufficient documentation

## 2021-05-27 LAB — CERVICOVAGINAL ANCILLARY ONLY
Bacterial Vaginitis (gardnerella): POSITIVE — AB
Candida Glabrata: NEGATIVE
Candida Vaginitis: NEGATIVE
Chlamydia: NEGATIVE
Comment: NEGATIVE
Comment: NEGATIVE
Comment: NEGATIVE
Comment: NEGATIVE
Comment: NEGATIVE
Comment: NORMAL
Neisseria Gonorrhea: NEGATIVE
Trichomonas: NEGATIVE

## 2021-05-28 ENCOUNTER — Other Ambulatory Visit: Payer: Self-pay | Admitting: Obstetrics

## 2021-05-28 DIAGNOSIS — B9689 Other specified bacterial agents as the cause of diseases classified elsewhere: Secondary | ICD-10-CM

## 2021-05-28 MED ORDER — METRONIDAZOLE 500 MG PO TABS: 500.0000 mg | ORAL_TABLET | Freq: Two times a day (BID) | ORAL | 2 refills | Status: DC

## 2021-05-30 LAB — CULTURE, BETA STREP (GROUP B ONLY): Strep Gp B Culture: NEGATIVE

## 2021-06-02 ENCOUNTER — Ambulatory Visit (INDEPENDENT_AMBULATORY_CARE_PROVIDER_SITE_OTHER): Payer: BC Managed Care – PPO

## 2021-06-02 VITALS — BP 136/83 | HR 98 | Wt 295.0 lb

## 2021-06-02 DIAGNOSIS — Z3483 Encounter for supervision of other normal pregnancy, third trimester: Secondary | ICD-10-CM

## 2021-06-02 DIAGNOSIS — Z3A37 37 weeks gestation of pregnancy: Secondary | ICD-10-CM

## 2021-06-02 DIAGNOSIS — Z6841 Body Mass Index (BMI) 40.0 and over, adult: Secondary | ICD-10-CM

## 2021-06-02 NOTE — Progress Notes (Signed)
ROB, c/o pressure, white discharge. ?

## 2021-06-02 NOTE — Progress Notes (Signed)
? ?  PRENATAL VISIT NOTE ? ?Subjective:  ?Emily Barnes is a 31 y.o. G2P1001 at [redacted]w[redacted]d being seen today for ongoing prenatal care.  She is currently monitored for the following issues for this low-risk pregnancy and has Encounter for supervision of other normal pregnancy, third trimester and BMI 40.0-44.9, adult (HCC) on their problem list. ? ?Patient reports  pelvic pressure .  Contractions: Not present. Vag. Bleeding: None.  Movement: Present. Denies leaking of fluid.  ? ?The following portions of the patient's history were reviewed and updated as appropriate: allergies, current medications, past family history, past medical history, past social history, past surgical history and problem list.  ? ?Objective:  ? ?Vitals:  ? 06/02/21 0859  ?BP: 136/83  ?Pulse: 98  ?Weight: 295 lb (133.8 kg)  ? ? ?Fetal Status: Fetal Heart Rate (bpm): 144 Fundal Height: 37 cm Movement: Present  Presentation: Vertex ? ?General:  Alert, oriented and cooperative. Patient is in no acute distress.  ?Skin: Skin is warm and dry. No rash noted.   ?Cardiovascular: Normal heart rate noted  ?Respiratory: Normal respiratory effort, no problems with respiration noted  ?Abdomen: Soft, gravid, appropriate for gestational age.  Pain/Pressure: Present     ?Pelvic: Cervical exam performed in the presence of a chaperone Dilation: 1 Effacement (%): Thick Station: Ballotable  ?Extremities: Normal range of motion.  Edema: None  ?Mental Status: Normal mood and affect. Normal behavior. Normal judgment and thought content.  ? ?Assessment and Plan:  ?Pregnancy: G2P1001 at [redacted]w[redacted]d ?1. Encounter for supervision of other normal pregnancy, third trimester ?- Routine OB. Doing well. ?- Reports constant pelvic pressure. Requesting cervical exam today. Reassurance provided on normalcy of pelvic pressure.  ?- Patient currently using RRT. Encouraged [employer] as well. May also use EPO.  ?- Patient desires IOL at 39 weeks due to husbands work schedule. Will submit  form and place orders today ? ?2. [redacted] weeks gestation of pregnancy ?- Endorses active fetal movement ?- FH appropriate ? ?3. BMI 40.0-44.9, adult (HCC) ?- Growth [place] on 4/13 wnl ?- EFW 45%, AFI normal ? ? ?Term labor symptoms and general obstetric precautions including but not limited to vaginal bleeding, contractions, leaking of fluid and fetal movement were reviewed in detail with the patient. ?Please refer to After Visit Summary for other counseling recommendations.  ? ?Return in about 1 week (around 06/09/2021). ? ?Future Appointments  ?Date Time Provider Department Center  ?06/09/2021  2:30 PM [place], Wilmer Floor, CNM [facility] None  ? ? ?Brand Males, CNM ? ?

## 2021-06-02 NOTE — Patient Instructions (Signed)
[email or web address] 

## 2021-06-09 ENCOUNTER — Ambulatory Visit (INDEPENDENT_AMBULATORY_CARE_PROVIDER_SITE_OTHER): Payer: BC Managed Care – PPO | Admitting: Advanced Practice Midwife

## 2021-06-09 VITALS — BP 122/87 | HR 107 | Wt 297.2 lb

## 2021-06-09 DIAGNOSIS — Z3A38 38 weeks gestation of pregnancy: Secondary | ICD-10-CM

## 2021-06-09 DIAGNOSIS — Z3483 Encounter for supervision of other normal pregnancy, third trimester: Secondary | ICD-10-CM

## 2021-06-09 NOTE — Progress Notes (Signed)
Patient presents for ROB. Patient complains of having a decreased appetite and acid reflux. Denies having any headache, swelling, or visual changes. Patient has no other concerns ?

## 2021-06-09 NOTE — Progress Notes (Signed)
? ?  PRENATAL VISIT NOTE ? ?Subjective:  ?Emily Barnes is a 31 y.o. G2P1001 at [redacted]w[redacted]d being seen today for ongoing prenatal care.  She is currently monitored for the following issues for this low-risk pregnancy and has Encounter for supervision of other normal pregnancy, third trimester and BMI 40.0-44.9, adult (HCC) on their problem list. ? ?Patient reports occasional contractions.  Contractions: Irregular. Vag. Bleeding: None.  Movement: Present. Denies leaking of fluid.  ? ?The following portions of the patient's history were reviewed and updated as appropriate: allergies, current medications, past family history, past medical history, past social history, past surgical history and problem list.  ? ?Objective:  ? ?Vitals:  ? 06/09/21 1423 06/09/21 1440  ?BP: 138/83 122/87  ?Pulse: (!) 106 (!) 107  ?Weight: 297 lb 3.2 oz (134.8 kg)   ? ? ?Fetal Status: Fetal Heart Rate (bpm): 145 Fundal Height: 37 cm Movement: Present  Presentation: Vertex ? ?General:  Alert, oriented and cooperative. Patient is in no acute distress.  ?Skin: Skin is warm and dry. No rash noted.   ?Cardiovascular: Normal heart rate noted  ?Respiratory: Normal respiratory effort, no problems with respiration noted  ?Abdomen: Soft, gravid, appropriate for gestational age.  Pain/Pressure: Present     ?Pelvic: Cervical exam performed in the presence of a chaperone Dilation: 1 Effacement (%): Thick Station: -3  ?Extremities: Normal range of motion.  Edema: None  ?Mental Status: Normal mood and affect. Normal behavior. Normal judgment and thought content.  ? ?Assessment and Plan:  ?Pregnancy: G2P1001 at [redacted]w[redacted]d ?1. Encounter for supervision of other normal pregnancy, third trimester ?--Anticipatory guidance about next visits/weeks of pregnancy given.  ?--Reviewed labor readiness with patient including the [facility] [employer], evening primrose oil, and raspberry leaf tea.   ?--IOL scheduled next week on 5/6 ? ?2. [redacted] weeks gestation of pregnancy ? ? ?Term labor  symptoms and general obstetric precautions including but not limited to vaginal bleeding, contractions, leaking of fluid and fetal movement were reviewed in detail with the patient. ?Please refer to After Visit Summary for other counseling recommendations.  ? ?Return for Pt has induction scheduled, schedule 6 week PP visit, LOB. ? ?Future Appointments  ?Date Time Provider Department Center  ?06/19/2021  6:30 AM [facility] SCHED ROOM [facility] None  ? ? ?Sharen Counter, CNM  ?

## 2021-06-09 NOTE — Patient Instructions (Signed)
Things to Try After 37 weeks to Encourage Labor/Get Ready for Labor:    Try the Miles Circuit at www.milescircuit.com daily to improve baby's position and encourage the onset of labor.  Walk a little and rest a little every day.  Change positions often.  Cervical Ripening: May try one or both Red Raspberry Leaf capsules or tea:  two 300mg or 400mg tablets with each meal, 2-3 times a day, or 1-3 cups of tea daily  Potential Side Effects Of Raspberry Leaf:  Most women do not experience any side effects from drinking raspberry leaf tea. However, nausea and loose stools are possible   Evening Primrose Oil capsules: take 1 capsule by mouth and place one capsule in the vagina every night.    Some of the potential side effects:  Upset stomach  Loose stools or diarrhea  Headaches  Nausea  Sex can also help the cervix ripen and encourage labor onset.   

## 2021-06-10 ENCOUNTER — Telehealth (HOSPITAL_COMMUNITY): Payer: Self-pay | Admitting: *Deleted

## 2021-06-10 ENCOUNTER — Encounter (HOSPITAL_COMMUNITY): Payer: Self-pay | Admitting: *Deleted

## 2021-06-10 NOTE — Telephone Encounter (Signed)
Preadmission screen  

## 2021-06-13 ENCOUNTER — Other Ambulatory Visit: Payer: Self-pay | Admitting: Advanced Practice Midwife

## 2021-06-19 ENCOUNTER — Inpatient Hospital Stay (HOSPITAL_COMMUNITY): Payer: BC Managed Care – PPO

## 2021-06-20 ENCOUNTER — Inpatient Hospital Stay (HOSPITAL_COMMUNITY): Payer: BC Managed Care – PPO | Admitting: Anesthesiology

## 2021-06-20 ENCOUNTER — Inpatient Hospital Stay (HOSPITAL_COMMUNITY)
Admission: AD | Admit: 2021-06-20 | Discharge: 2021-06-22 | DRG: 806 | Disposition: A | Discharge status: Home / Self Care | Payer: BC Managed Care – PPO | Attending: Obstetrics and Gynecology | Admitting: Obstetrics and Gynecology

## 2021-06-20 ENCOUNTER — Other Ambulatory Visit: Payer: Self-pay

## 2021-06-20 ENCOUNTER — Encounter (HOSPITAL_COMMUNITY): Payer: Self-pay | Admitting: Obstetrics & Gynecology

## 2021-06-20 DIAGNOSIS — Z3A39 39 weeks gestation of pregnancy: Secondary | ICD-10-CM

## 2021-06-20 DIAGNOSIS — O99214 Obesity complicating childbirth: Secondary | ICD-10-CM | POA: Diagnosis present

## 2021-06-20 DIAGNOSIS — D62 Acute posthemorrhagic anemia: Secondary | ICD-10-CM | POA: Diagnosis not present

## 2021-06-20 DIAGNOSIS — O26893 Other specified pregnancy related conditions, third trimester: Secondary | ICD-10-CM | POA: Diagnosis present

## 2021-06-20 DIAGNOSIS — Z3490 Encounter for supervision of normal pregnancy, unspecified, unspecified trimester: Secondary | ICD-10-CM | POA: Diagnosis present

## 2021-06-20 DIAGNOSIS — Z3483 Encounter for supervision of other normal pregnancy, third trimester: Secondary | ICD-10-CM

## 2021-06-20 DIAGNOSIS — O9081 Anemia of the puerperium: Secondary | ICD-10-CM | POA: Diagnosis not present

## 2021-06-20 DIAGNOSIS — Z6841 Body Mass Index (BMI) 40.0 and over, adult: Secondary | ICD-10-CM

## 2021-06-20 LAB — CBC
HCT: 32.1 % — ABNORMAL LOW (ref 36.0–46.0)
Hemoglobin: 10.4 g/dL — ABNORMAL LOW (ref 12.0–15.0)
MCH: 23 pg — ABNORMAL LOW (ref 26.0–34.0)
MCHC: 32.4 g/dL (ref 30.0–36.0)
MCV: 71 fL — ABNORMAL LOW (ref 80.0–100.0)
Platelets: 282 10*3/uL (ref 150–400)
RBC: 4.52 MIL/uL (ref 3.87–5.11)
RDW: 15.3 % (ref 11.5–15.5)
WBC: 9 10*3/uL (ref 4.0–10.5)
nRBC: 0 % (ref 0.0–0.2)

## 2021-06-20 LAB — RPR: RPR Ser Ql: NONREACTIVE

## 2021-06-20 LAB — TYPE AND SCREEN
ABO/RH(D): AB POS
Antibody Screen: NEGATIVE

## 2021-06-20 MED ORDER — FENTANYL-BUPIVACAINE-NACL 0.5-0.125-0.9 MG/250ML-% EP SOLN
12.0000 mL/h | EPIDURAL | Status: DC | PRN
  Administered 2021-06-20: 12 mL/h via EPIDURAL
  Filled 2021-06-20: qty 250

## 2021-06-20 MED ORDER — SOD CITRATE-CITRIC ACID 500-334 MG/5ML PO SOLN: 30.0000 mL | ORAL | Status: DC | PRN

## 2021-06-20 MED ORDER — ACETAMINOPHEN 325 MG PO TABS: 650.0000 mg | ORAL_TABLET | ORAL | Status: DC | PRN

## 2021-06-20 MED ORDER — EPHEDRINE 5 MG/ML INJ: 10.0000 mg | INTRAVENOUS | Status: DC | PRN

## 2021-06-20 MED ORDER — LACTATED RINGERS IV SOLN: INTRAVENOUS | Status: DC

## 2021-06-20 MED ORDER — ONDANSETRON HCL 4 MG/2ML IJ SOLN: 4.0000 mg | Freq: Four times a day (QID) | INTRAMUSCULAR | Status: DC | PRN

## 2021-06-20 MED ORDER — OXYTOCIN-SODIUM CHLORIDE 30-0.9 UT/500ML-% IV SOLN: 2.5000 [IU]/h | INTRAVENOUS | Status: DC

## 2021-06-20 MED ORDER — LACTATED RINGERS IV SOLN: 500.0000 mL | Freq: Once | INTRAVENOUS | Status: DC

## 2021-06-20 MED ORDER — MISOPROSTOL 50MCG HALF TABLET
50.0000 ug | ORAL_TABLET | ORAL | Status: DC | PRN
  Administered 2021-06-20 (×2): 50 ug via BUCCAL
  Filled 2021-06-20 (×2): qty 1

## 2021-06-20 MED ORDER — DIPHENHYDRAMINE HCL 50 MG/ML IJ SOLN: 12.5000 mg | INTRAMUSCULAR | Status: DC | PRN

## 2021-06-20 MED ORDER — TERBUTALINE SULFATE 1 MG/ML IJ SOLN: 0.2500 mg | Freq: Once | INTRAMUSCULAR | Status: DC | PRN

## 2021-06-20 MED ORDER — OXYTOCIN BOLUS FROM INFUSION
333.0000 mL | Freq: Once | INTRAVENOUS | Status: AC
  Administered 2021-06-21: 333 mL via INTRAVENOUS

## 2021-06-20 MED ORDER — PHENYLEPHRINE 80 MCG/ML (10ML) SYRINGE FOR IV PUSH (FOR BLOOD PRESSURE SUPPORT)
80.0000 ug | PREFILLED_SYRINGE | INTRAVENOUS | Status: DC | PRN
  Filled 2021-06-20: qty 10

## 2021-06-20 MED ORDER — LACTATED RINGERS IV SOLN: 500.0000 mL | INTRAVENOUS | Status: DC | PRN

## 2021-06-20 MED ORDER — LIDOCAINE HCL (PF) 1 % IJ SOLN
INTRAMUSCULAR | Status: DC | PRN
  Administered 2021-06-20: 5 mL via EPIDURAL
  Administered 2021-06-20: 3 mL via EPIDURAL
  Administered 2021-06-20: 2 mL via EPIDURAL

## 2021-06-20 MED ORDER — OXYCODONE-ACETAMINOPHEN 5-325 MG PO TABS: 2.0000 | ORAL_TABLET | ORAL | Status: DC | PRN

## 2021-06-20 MED ORDER — OXYTOCIN-SODIUM CHLORIDE 30-0.9 UT/500ML-% IV SOLN
1.0000 m[IU]/min | INTRAVENOUS | Status: DC
  Administered 2021-06-20: 2 m[IU]/min via INTRAVENOUS
  Filled 2021-06-20: qty 500

## 2021-06-20 MED ORDER — LIDOCAINE HCL (PF) 1 % IJ SOLN
30.0000 mL | INTRAMUSCULAR | Status: DC | PRN
Start: 2021-06-20 — End: 2021-06-21

## 2021-06-20 MED ORDER — OXYCODONE-ACETAMINOPHEN 5-325 MG PO TABS: 1.0000 | ORAL_TABLET | ORAL | Status: DC | PRN

## 2021-06-20 MED ORDER — PHENYLEPHRINE 80 MCG/ML (10ML) SYRINGE FOR IV PUSH (FOR BLOOD PRESSURE SUPPORT): 80.0000 ug | PREFILLED_SYRINGE | INTRAVENOUS | Status: DC | PRN

## 2021-06-20 NOTE — Anesthesia Procedure Notes (Signed)
Epidural ?Patient location during procedure: OB ?Start time: 06/20/2021 11:10 PM ?End time: 06/20/2021 11:17 PM ? ?Staffing ?Anesthesiologist: Santa Lighter, MD ?Performed: anesthesiologist  ? ?Preanesthetic Checklist ?Completed: patient identified, IV checked, risks and benefits discussed, monitors and equipment checked, pre-op evaluation and timeout performed ? ?Epidural ?Patient position: sitting ?Prep: DuraPrep ?Patient monitoring: blood pressure and continuous pulse ox ?Approach: midline ?Location: L3-L4 ?Injection technique: LOR air ? ?Needle:  ?Needle type: Tuohy  ?Needle gauge: 17 G ?Needle length: 9 cm ?Needle insertion depth: 8 cm ?Catheter size: 19 Gauge ?Catheter at skin depth: 14 cm ?Test dose: negative and Other (1% Lidocaine) ? ?Additional Notes ?Patient identified.  Risk benefits discussed including failed block, incomplete pain control, headache, nerve damage, paralysis, blood pressure changes, nausea, vomiting, reactions to medication both toxic or allergic, and postpartum back pain.  Patient expressed understanding and wished to proceed.  All questions were answered.  Sterile technique used throughout procedure and epidural site dressed with sterile barrier dressing. No paresthesia or other complications noted. The patient did not experience any signs of intravascular injection such as tinnitus or metallic taste in mouth nor signs of intrathecal spread such as rapid motor block. Please see nursing notes for vital signs. ?Reason for block:procedure for pain ? ? ? ?

## 2021-06-20 NOTE — Anesthesia Preprocedure Evaluation (Signed)
Anesthesia Evaluation  ?Patient identified by MRN, date of birth, ID band ?Patient awake ? ? ? ?Reviewed: ?Allergy & Precautions, NPO status , Patient's Chart, lab work & pertinent test results ? ?Airway ?Mallampati: II ? ?TM Distance: >3 FB ?Neck ROM: Full ? ? ? Dental ? ?(+) Teeth Intact, Dental Advisory Given ?  ?Pulmonary ?neg pulmonary ROS,  ?  ?Pulmonary exam normal ?breath sounds clear to auscultation ? ? ? ? ? ? Cardiovascular ?negative cardio ROS ?Normal cardiovascular exam ?Rhythm:Regular Rate:Normal ? ? ?  ?Neuro/Psych ? Headaches,   ? GI/Hepatic ?negative GI ROS, Neg liver ROS,   ?Endo/Other  ?Morbid obesity ? Renal/GU ?negative Renal ROS  ? ?  ?Musculoskeletal ?negative musculoskeletal ROS ?(+)  ? Abdominal ?  ?Peds ? Hematology ? ?(+) Blood dyscrasia, anemia , Plt 282k   ?Anesthesia Other Findings ?Day of surgery medications reviewed with the patient. ? Reproductive/Obstetrics ?(+) Pregnancy ? ?  ? ? ? ? ? ? ? ? ? ? ? ? ? ?  ?  ? ? ? ? ? ? ? ? ?Anesthesia Physical ?Anesthesia Plan ? ?ASA: 3 ? ?Anesthesia Plan: Epidural  ? ?Post-op Pain Management:   ? ?Induction:  ? ?PONV Risk Score and Plan: 2 and Treatment may vary due to age or medical condition ? ?Airway Management Planned: Natural Airway ? ?Additional Equipment:  ? ?Intra-op Plan:  ? ?Post-operative Plan:  ? ?Informed Consent: I have reviewed the patients History and Physical, chart, labs and discussed the procedure including the risks, benefits and alternatives for the proposed anesthesia with the patient or authorized representative who has indicated his/her understanding and acceptance.  ? ? ? ?Dental advisory given ? ?Plan Discussed with:  ? ?Anesthesia Plan Comments: (Patient identified. Risks/Benefits/Options discussed with patient including but not limited to bleeding, infection, nerve damage, paralysis, failed block, incomplete pain control, headache, blood pressure changes, nausea, vomiting, reactions to  medication both or allergic, itching and postpartum back pain. Confirmed with bedside nurse the patient's most recent platelet count. Confirmed with patient that they are not currently taking any anticoagulation, have any bleeding history or any family history of bleeding disorders. Patient expressed understanding and wished to proceed. All questions were answered. )  ? ? ? ? ? ? ?Anesthesia Quick Evaluation ? ?

## 2021-06-20 NOTE — Progress Notes (Signed)
Labor Progress Note ?Emily Barnes is a 31 y.o. G2P1001 at [redacted]w[redacted]d presented for eIOL.  ? ?S: Doing well.  ? ?O:  ?BP 137/88   Pulse 80   Temp 98.3 ?F (36.8 ?C) (Oral)   Resp 18   Ht 5\' 10"  (1.778 m)   Wt 135.6 kg   LMP 09/15/2020   BMI 42.89 kg/m?  ?EFM: 130/mod/15x15/none ? ?CVE: Dilation: 3 ?Effacement (%): 50 ?Station: -3 ?Presentation: Vertex ?Exam by:: Dr. Higinio Plan ? ? ?A&P: 31 y.o. G2P1001 [redacted]w[redacted]d  ?#Labor: Progressing well s/p cytotec x2. Discussed transitioning to pit vs 3rd cytotec, she would like to try pit. Will monitor closely, can return to cytotec if needed but suspect her cervix will thin with this as well.  ?#Pain: PRN ?#FWB: Cat I  ?#GBS negative ? ?Emily Clan, DO ?6:59 PM  ?

## 2021-06-20 NOTE — Progress Notes (Signed)
Labor Progress Note ?Emily Barnes is a 31 y.o. G2P1001 at [redacted]w[redacted]d presented for term IOL.  ? ?S: Doing well. Having some cramping.  ? ?O:  ?BP 113/75   Pulse 99   Temp 98 ?F (36.7 ?C) (Oral)   Resp 18   Ht 5\' 10"  (1.778 m)   Wt 135.6 kg   LMP 09/15/2020   BMI 42.89 kg/m?  ?EFM: 130/mod/15x15/none ? ?CVE: Dilation: 1.5 ?Effacement (%): 50 ?Station: -3 ?Presentation: Vertex ?Exam by:: Dr. [email or web address] ? ? ?A&P: 31 y.o. G2P1001 [redacted]w[redacted]d  ?#Labor: Progressing nicely s/p one cytotec. After verbal consent, attempted to place FB but could not push FB past fetal head. Given second cytotec. Hopefully she will continue to progress nicely without, but if needed will attempt FB again on next check.  ?#Pain: PRN ?#FWB: Cat I  ?#GBS negative ? ?[redacted]w[redacted]d, DO ?12:05 PM  ?

## 2021-06-20 NOTE — H&P (Signed)
OBSTETRIC ADMISSION HISTORY AND PHYSICAL ? ?Emily Barnes is a 31 y.o. female G2P1001 with IUP at 24w5dby LMP presenting for IOL at term. She reports +FMs, No LOF, no VB, no blurry vision, headaches or peripheral edema, and RUQ pain.  She plans on breast feeding. She request condoms for birth control. ?[facility]   ? ?Dating: By LMP --->  Estimated Date of Delivery: 06/22/21 ? ?Sono:   ? ?@[redacted]w[redacted]d , CWD, normal anatomy, cephalic presentation, 21245Y 45% EFW ? ? ?Prenatal History/Complications:  ?--BMI 42 ?--History of headaches in pregnancy, tension related  ? ?Past Medical History: ?Past Medical History:  ?Diagnosis Date  ? Headache   ? Medical history non-contributory   ? ? ?Past Surgical History: ?Past Surgical History:  ?Procedure Laterality Date  ? CYST EXCISION Left   ? axilla  ? ? ?Obstetrical History: ?OB History   ? ? Gravida  ?2  ? Para  ?1  ? Term  ?1  ? Preterm  ?0  ? AB  ?0  ? Living  ?1  ?  ? ? SAB  ?0  ? IAB  ?0  ? Ectopic  ?0  ? Multiple  ?0  ? Live Births  ?1  ?   ?  ?  ? ? ?Social History ?Social History  ? ?Socioeconomic History  ? Marital status: Married  ?  Spouse name: Not on file  ? Number of children: Not on file  ? Years of education: Not on file  ? Highest education level: Not on file  ?Occupational History  ? Not on file  ?Tobacco Use  ? Smoking status: Never  ? Smokeless tobacco: Never  ?Vaping Use  ? Vaping Use: Never used  ?Substance and Sexual Activity  ? Alcohol use: Not Currently  ?  Comment: occ, but not since confirmed pregnancy  ? Drug use: Not Currently  ?  Comment: CBD and Hemp prior to pregnancy  ? Sexual activity: Yes  ?  Partners: Male  ?  Birth control/protection: None  ?Other Topics Concern  ? Not on file  ?Social History Narrative  ? ** Merged History Encounter **  ?    ? ?Social Determinants of Health  ? ?Financial Resource Strain: Not on file  ?Food Insecurity: Not on file  ?Transportation Needs: Not on file  ?Physical Activity: Not on  file  ?Stress: Not on file  ?Social Connections: Not on file  ? ? ?Family History: ?Family History  ?Problem Relation Age of Onset  ? Diabetes Father   ? Alcohol abuse Neg Hx   ? Arthritis Neg Hx   ? Asthma Neg Hx   ? Birth defects Neg Hx   ? Cancer Neg Hx   ? COPD Neg Hx   ? Depression Neg Hx   ? Drug abuse Neg Hx   ? Early death Neg Hx   ? Hearing loss Neg Hx   ? Heart disease Neg Hx   ? Hyperlipidemia Neg Hx   ? Hypertension Neg Hx   ? Kidney disease Neg Hx   ? Learning disabilities Neg Hx   ? Mental illness Neg Hx   ? Mental retardation Neg Hx   ? Miscarriages / Stillbirths Neg Hx   ? Stroke Neg Hx   ? Vision loss Neg Hx   ? Varicose Veins Neg Hx   ? ? ?Allergies: ?Allergies  ?Allergen Reactions  ? Tape   ?  Burn from skin tape  ? ? ?  Medications Prior to Admission  ?Medication Sig Dispense Refill Last Dose  ? Blood Pressure Monitoring (BLOOD PRESSURE KIT) DEVI 1 kit by Does not apply route once a week. 1 each 0   ? cyclobenzaprine (FLEXERIL) 10 MG tablet Take 1 tablet (10 mg total) by mouth 3 (three) times daily as needed for muscle spasms. 30 tablet 2   ? Prenatal MV & Min w/FA-DHA (PRENATAL GUMMIES PO) Take by mouth.     ? ? ? ?Review of Systems  ? ?All systems reviewed and negative except as stated in HPI ? ?Blood pressure (!) 127/93, pulse (!) 105, temperature 98.1 ?F (36.7 ?C), temperature source Oral, resp. rate 20, height 5' 10"  (1.778 m), weight 135.6 kg, last menstrual period 09/15/2020, unknown if currently breastfeeding. ?General appearance: alert, cooperative, and no distress ?Lungs: Normal WOB  ?Heart: regular rate ?Abdomen: soft, non-tender ?Pelvic: NEFG ?Extremities: Homans sign is negative, no sign of DVT ?Presentation: cephalic ?Fetal monitoringBaseline: 135 bpm, Variability: Good {> 6 bpm), Accelerations: Reactive, and Decelerations: Absent ?Uterine activity None  ?Dilation: 1 ?Effacement (%): Thick ?Station: -3 ?Exam by:: Margarette Asal, RN ? ? ?Prenatal labs: ?ABO, Rh: --/--/PENDING (05/07  5947) ?Antibody: PENDING (05/07 0761) ?Rubella: 3.63 (11/01 5183) ?RPR: Non Reactive (02/15 0927)  ?HBsAg: Negative (11/01 0856)  ?HIV: Non Reactive (02/15 0927)  ?GBS: Negative/-- (04/12 4373)  ?2 hr Glucola passed ?Genetic screening  LR ?Anatomy US WNL ? ?Prenatal Transfer Tool  ?Maternal Diabetes: No ?Genetic Screening: Normal ?Maternal Ultrasounds/Referrals: Normal ?Fetal Ultrasounds or other Referrals:  None ?Maternal Substance Abuse:  No ?Significant Maternal Medications:  None ?Significant Maternal Lab Results: Group B Strep negative ? ?Results for orders placed or performed during the hospital encounter of 06/20/21 (from the past 24 hour(s))  ?Type and screen  ? Collection Time: 06/20/21  6:43 AM  ?Result Value Ref Range  ? ABO/RH(D) PENDING   ? Antibody Screen PENDING   ? Sample Expiration    ?  06/23/2021,2359 ?Performed at [facility], [place] [address] [place], [place] [address] ?  ?CBC  ? Collection Time: 06/20/21  6:52 AM  ?Result Value Ref Range  ? WBC 9.0 4.0 - 10.5 K/uL  ? RBC 4.52 3.87 - 5.11 MIL/uL  ? Hemoglobin 10.4 (L) 12.0 - 15.0 g/dL  ? HCT 32.1 (L) 36.0 - 46.0 %  ? MCV 71.0 (L) 80.0 - 100.0 fL  ? MCH 23.0 (L) 26.0 - 34.0 pg  ? MCHC 32.4 30.0 - 36.0 g/dL  ? RDW 15.3 11.5 - 15.5 %  ? Platelets 282 150 - 400 K/uL  ? nRBC 0.0 0.0 - 0.2 %  ? ? ?Patient Active Problem List  ? Diagnosis Date Noted  ? Indication for care in labor or delivery 06/20/2021  ? BMI 40.0-44.9, adult ([place]) 04/28/2021  ? Encounter for supervision of other normal pregnancy, third trimester 11/26/2020  ? ? ?Assessment/Plan:  ?Emily Barnes is a 31 y.o. G2P1001 at 26w5dhere for IOL at term.  ? ?#Labor: Start with cytotec and reassess for FB.  ?#Pain: PRN ?#FWB: Cat I  ?#ID: GBS Negative  ?#MOF: Breast ?#MOC: Condoms  ?#Circ: yes ? ?SPatriciaann Clan DO  ?06/20/2021, 8:11 AM ? ?  ?

## 2021-06-21 ENCOUNTER — Encounter (HOSPITAL_COMMUNITY): Payer: Self-pay | Admitting: Obstetrics & Gynecology

## 2021-06-21 DIAGNOSIS — Z3A39 39 weeks gestation of pregnancy: Secondary | ICD-10-CM

## 2021-06-21 LAB — CBC
HCT: 29.7 % — ABNORMAL LOW (ref 36.0–46.0)
Hemoglobin: 9.6 g/dL — ABNORMAL LOW (ref 12.0–15.0)
MCH: 23.2 pg — ABNORMAL LOW (ref 26.0–34.0)
MCHC: 32.3 g/dL (ref 30.0–36.0)
MCV: 71.9 fL — ABNORMAL LOW (ref 80.0–100.0)
Platelets: 247 10*3/uL (ref 150–400)
RBC: 4.13 MIL/uL (ref 3.87–5.11)
RDW: 15.5 % (ref 11.5–15.5)
WBC: 12.6 10*3/uL — ABNORMAL HIGH (ref 4.0–10.5)
nRBC: 0 % (ref 0.0–0.2)

## 2021-06-21 MED ORDER — ZOLPIDEM TARTRATE 5 MG PO TABS: 5.0000 mg | ORAL_TABLET | Freq: Every evening | ORAL | Status: DC | PRN

## 2021-06-21 MED ORDER — SENNOSIDES-DOCUSATE SODIUM 8.6-50 MG PO TABS
2.0000 | ORAL_TABLET | Freq: Every day | ORAL | Status: DC
  Administered 2021-06-22: 2 via ORAL
  Filled 2021-06-21: qty 2

## 2021-06-21 MED ORDER — BENZOCAINE-MENTHOL 20-0.5 % EX AERO
1.0000 | INHALATION_SPRAY | CUTANEOUS | Status: DC | PRN
Start: 2021-06-21 — End: 2021-06-22
  Administered 2021-06-21: 1 via TOPICAL
  Filled 2021-06-21: qty 56

## 2021-06-21 MED ORDER — TETANUS-DIPHTH-ACELL PERTUSSIS 5-2.5-18.5 LF-MCG/0.5 IM SUSY: 0.5000 mL | PREFILLED_SYRINGE | Freq: Once | INTRAMUSCULAR | Status: DC

## 2021-06-21 MED ORDER — WITCH HAZEL-GLYCERIN EX PADS: 1.0000 "application " | MEDICATED_PAD | CUTANEOUS | Status: DC | PRN

## 2021-06-21 MED ORDER — PRENATAL MULTIVITAMIN CH
1.0000 | ORAL_TABLET | Freq: Every day | ORAL | Status: DC
  Administered 2021-06-21 – 2021-06-22 (×2): 1 via ORAL
  Filled 2021-06-21 (×2): qty 1

## 2021-06-21 MED ORDER — COCONUT OIL OIL: 1.0000 "application " | TOPICAL_OIL | Status: DC | PRN

## 2021-06-21 MED ORDER — ACETAMINOPHEN 325 MG PO TABS
650.0000 mg | ORAL_TABLET | ORAL | Status: DC | PRN
  Administered 2021-06-22 (×2): 650 mg via ORAL
  Filled 2021-06-21 (×2): qty 2

## 2021-06-21 MED ORDER — DIPHENHYDRAMINE HCL 25 MG PO CAPS: 25.0000 mg | ORAL_CAPSULE | Freq: Four times a day (QID) | ORAL | Status: DC | PRN

## 2021-06-21 MED ORDER — ONDANSETRON HCL 4 MG PO TABS: 4.0000 mg | ORAL_TABLET | ORAL | Status: DC | PRN

## 2021-06-21 MED ORDER — SIMETHICONE 80 MG PO CHEW: 80.0000 mg | CHEWABLE_TABLET | ORAL | Status: DC | PRN

## 2021-06-21 MED ORDER — DIBUCAINE (PERIANAL) 1 % EX OINT: 1.0000 "application " | TOPICAL_OINTMENT | CUTANEOUS | Status: DC | PRN

## 2021-06-21 MED ORDER — IBUPROFEN 600 MG PO TABS
600.0000 mg | ORAL_TABLET | Freq: Four times a day (QID) | ORAL | Status: DC
  Administered 2021-06-21 – 2021-06-22 (×6): 600 mg via ORAL
  Filled 2021-06-21 (×6): qty 1

## 2021-06-21 MED ORDER — ONDANSETRON HCL 4 MG/2ML IJ SOLN: 4.0000 mg | INTRAMUSCULAR | Status: DC | PRN

## 2021-06-21 NOTE — Lactation Note (Signed)
This note was copied from a baby's chart. ?Lactation Consultation Note ? ?Patient Name: Emily Barnes ?Today's Date: 06/21/2021 ?  ?Age:29 hours ? ? ?LC Note: ? ?Second attempt to visit with family, however, all family members asleep at this time.  RN updated and will call me when mother is awake. ? ? ?Maternal Data ?  ? ?Feeding ?  ? ?LATCH Score ?  ? ?  ? ?  ? ?  ? ?  ? ?  ? ? ?Lactation Tools Discussed/Used ?  ? ?Interventions ?  ? ?Discharge ?  ? ?Consult Status ?  ? ? ? ?Amaru Burroughs R Gerold Sar ?06/21/2021, 10:59 AM ? ? ? ?

## 2021-06-21 NOTE — Lactation Note (Signed)
This note was copied from a baby's chart. ?Lactation Consultation Note ? ?Patient Name: Emily Barnes ?Today's Date: 06/21/2021 ?  ?Age:29 hours ? ? ?LC Note: ? ?Attempted to visit with family, however, all members asleep at this time. ? ? ?Maternal Data ?  ? ?Feeding ?  ? ?LATCH Score ?  ? ?  ? ?  ? ?  ? ?  ? ?  ? ? ?Lactation Tools Discussed/Used ?  ? ?Interventions ?  ? ?Discharge ?  ? ?Consult Status ?  ? ? ? ?Jevin Camino R Elianna Windom ?06/21/2021, 7:40 AM ? ? ? ?

## 2021-06-21 NOTE — Discharge Summary (Signed)
? ?  Postpartum Discharge Summary ? ?   ?Patient Name: Emily Barnes ?DOB: Jun 23, 1990 ?MRN: [id number] ? ?Date of admission: 06/20/2021 ?Delivery date:06/21/2021  ?Delivering provider: Alen Bleacher  ?Date of discharge: 06/22/2021 ? ?Admitting diagnosis: Indication for care in labor or delivery [O75.9] ?Intrauterine pregnancy: [redacted]w[redacted]d    ?Secondary diagnosis:  Principal Problem: ?  Vaginal delivery ?Active Problems: ?  Encounter for supervision of other normal pregnancy, third trimester ?  BMI 40.0-44.9, adult ([facility] ?  Encounter for elective induction of labor ? ?Additional problems: Acute blood loss anemia, mild, asymptomatic     ?Discharge diagnosis: Term Pregnancy Delivered                                              ?Post partum procedures: None ?Augmentation: AROM, Pitocin, and Cytotec ?Complications: None ? ?Hospital course: Induction of Labor With Vaginal Delivery   ?31y.o. yo G2P2002 at 344w6das admitted to the hospital 06/20/2021 for induction of labor.  Indication for induction: Elective.  Patient had an uncomplicated labor course as follows: ?Membrane Rupture Time/Date: 9:36 PM ,06/20/2021   ?Delivery Method:Vaginal, Spontaneous  ?Episiotomy: None  ?Lacerations:  None  ?Details of delivery can be found in separate delivery note.  Patient had a routine postpartum course.  She is eating, drinking, voiding, and ambulating without issue.  Her hemoglobin was 9.6 after delivery, down from 10.4.  She remained asymptomatic from this while inpatient.  Her pain and bleeding are controlled.  She is breastfeeding well.  Patient is discharged home 06/22/21. ? ?Newborn Data: ?Birth date:06/[birth year]21  ?Birth time:12:42 AM  ?Gender:Female  ?Living status:Living  ?Apgars:9 ,9  ?Weight:3230 g  ? ?Magnesium Sulfate received: No ?BMZ received: No ?Rhophylac: N/A ?MMR: N/A ?T-DaP: Given prenatally ?Flu: Yes ?Transfusion: No ? ?Physical exam  ?Vitals:  ? 06/21/21 0404 06/21/21 1200 06/21/21 1944 06/22/21 0452  ?BP: 126/62 131/71 (!)  105/56 123/77  ?Pulse: 87 81 85 78  ?Resp: _0 ?Temp: 98.1 ?F (36.7 ?C) 98.6 ?F (37 ?C) 98.1 ?F (36.7 ?C) 97.8 ?F (36.6 ?C)  ?TempSrc: Oral Oral Oral Oral  ?SpO2: 100%     ?Weight:      ?Height:      ? ?General: alert, cooperative, and no distress ?Lochia: appropriate ?Uterine Fundus: firm and below umbilicus  ?DVT Evaluation: no LE edema or calf tenderness to palpation  ? ?Labs: ?Lab Results  ?Component Value Date  ? WBC 12.6 (H) 06/21/2021  ? HGB 9.6 (L) 06/21/2021  ? HCT 29.7 (L) 06/21/2021  ? MCV 71.9 (L) 06/21/2021  ? PLT 247 06/21/2021  ? ? ?  Latest Ref Rng & Units 11/27/2020  ?  1:20 PM  ?CMP  ?Glucose 70 - 99 mg/dL 89    ?BUN 6 - 20 mg/dL 7    ?Creatinine 0.44 - 1.00 mg/dL 0.72    ?Sodium 135 - 145 mmol/L 134    ?Potassium 3.5 - 5.1 mmol/L 4.5    ?Chloride 98 - 111 mmol/L 104    ?CO2 22 - 32 mmol/L 23    ?Calcium 8.9 - 10.3 mg/dL 8.9    ?Total Protein 6.5 - 8.1 g/dL 6.7    ?Total Bilirubin 0.3 - 1.2 mg/dL 0.8    ?Alkaline Phos 38 - 126 U/L 78    ?AST 15 - 41 U/L 23    ?ALT 0 -  44 U/L 12    ? ?Edinburgh Score: ? ?  06/21/2021  ?  5:44 PM  ?Flavia Shipper Postnatal Depression Scale Screening Tool  ?I have been able to laugh and see the funny side of things. 0  ?I have looked forward with enjoyment to things. 0  ?I have blamed myself unnecessarily when things went wrong. 1  ?I have been anxious or worried for no good reason. 0  ?I have felt scared or panicky for no good reason. 0  ?Things have been getting on top of me. 0  ?I have been so unhappy that I have had difficulty sleeping. 0  ?I have felt sad or miserable. 0  ?I have been so unhappy that I have been crying. 0  ?The thought of harming myself has occurred to me. 0  ?Edinburgh Postnatal Depression Scale Total 1  ? ? ? ?After visit meds:  ?Allergies as of 06/22/2021   ? ?   Reactions  ? Tape   ? Burn from skin tape  ? ?  ? ?  ?Medication List  ?  ? ?STOP taking these medications   ? ?cyclobenzaprine 10 MG tablet ?Commonly known as: FLEXERIL ?  ? ?   ? ?TAKE these medications   ? ?acetaminophen 500 MG tablet ?Commonly known as: TYLENOL ?Take 2 tablets (1,000 mg total) by mouth every 8 (eight) hours as needed (pain). ?  ?Blood Pressure Kit Devi ?1 kit by Does not apply route once a week. ?  ?ibuprofen 600 MG tablet ?Commonly known as: ADVIL ?Take 1 tablet (600 mg total) by mouth every 6 (six) hours as needed (pain). ?  ?PRENATAL GUMMIES PO ?Take by mouth. ?  ? ?  ? ? ? ?Discharge home in stable condition ?Infant Feeding: Breast ?Infant Disposition: home with mother ?Discharge instruction: per After Visit Summary and Postpartum booklet. ?Activity: Advance as tolerated. Pelvic rest for 6 weeks.  ?Diet: routine diet ?Future Appointments: ?Future Appointments  ?Date Time Provider [place]  ?08/02/2021  3:10 PM Griffin Basil, MD [place] None  ? ?Follow up Visit: ?Message sent to [facility] by Dr. Cy Blamer on 5/8 ? ?Please schedule this patient for a In person postpartum visit in 6 weeks with the following provider: Any provider. ?Additional Postpartum F/U: None   ?Low risk pregnancy complicated by:  None ?Delivery mode:  Vaginal, Spontaneous  ?Anticipated Birth Control:  Condoms ? ?06/22/2021 ?Genia Del, MD ? ? ? ?

## 2021-06-21 NOTE — Anesthesia Postprocedure Evaluation (Signed)
Anesthesia Post Note ? ?Patient: Aaisha Backs ? ?Procedure(s) Performed: AN AD HOC LABOR EPIDURAL ? ?  ? ?Patient location during evaluation: Mother Baby ?Anesthesia Type: Epidural ?Level of consciousness: awake ?Pain management: satisfactory to patient ?Vital Signs Assessment: post-procedure vital signs reviewed and stable ?Respiratory status: spontaneous breathing ?Cardiovascular status: stable ?Anesthetic complications: no ? ? ?No notable events documented. ? ?Last Vitals:  ?Vitals:  ? 06/21/21 0305 06/21/21 0404  ?BP: 140/75 126/62  ?Pulse: 89 87  ?Resp: 20 20  ?Temp: 37 ?C 36.7 ?C  ?SpO2: 100% 100%  ?  ?Last Pain:  ?Vitals:  ? 06/21/21 0410  ?TempSrc:   ?PainSc: 0-No pain  ? ?Pain Goal: Patients Stated Pain Goal: 2 (06/21/21 0305) ? ?  ?  ?  ?  ?  ?  ?  ? ?Miche Loughridge ? ? ? ? ?

## 2021-06-21 NOTE — Lactation Note (Signed)
This note was copied from a baby's chart. ?Lactation Consultation Note ? ?Patient Name: Boy Brittney Aumiller ?Today's Date: 06/21/2021 ?  ?Age:29 hours ? ? ?LC Note: ? ?Per RN, mother declines lactation services. ? ? ?Maternal Data ?  ? ?Feeding ?  ? ?LATCH Score ?  ? ?  ? ?  ? ?  ? ?  ? ?  ? ? ?Lactation Tools Discussed/Used ?  ? ?Interventions ?  ? ?Discharge ?  ? ?Consult Status ?  ? ? ? ?Dawnmarie Breon R Messi Twedt ?06/21/2021, 2:14 PM ? ? ? ?

## 2021-06-21 NOTE — Lactation Note (Signed)
This note was copied from a baby's chart. ?Lactation Consultation Note ?When LC came into rm. Mom had baby latched already. ?Praised mom for good job. ?Mom BF her now 31 yr old for 1 1/2 yrs. ?Baby latched well but off and on some w/long burst of suckling. ?Answered questions that mom had. Mom will be f/u on MBU. ? ?Patient Name: Emily Barnes ?Today's Date: 06/21/2021 ?Reason for consult: L&D Initial assessment;Term ?Age:51 hours ? ?Maternal Data ?  ? ?Feeding ?  ? ?LATCH Score ?Latch: Grasps breast easily, tongue down, lips flanged, rhythmical sucking. ? ?Audible Swallowing: None ? ?Type of Nipple: Everted at rest and after stimulation ? ?Comfort (Breast/Nipple): Soft / non-tender ? ?Hold (Positioning): Assistance needed to correctly position infant at breast and maintain latch. ? ?LATCH Score: 7 ? ? ?Lactation Tools Discussed/Used ?  ? ?Interventions ?Interventions: Adjust position;Assisted with latch;Support pillows;Skin to skin;Breast compression ? ?Discharge ?  ? ?Consult Status ?Consult Status: Follow-up from L&D ?Date: 06/21/21 ?Follow-up type: In-patient ? ? ? ?Charyl Dancer ?06/21/2021, 1:23 AM ? ? ? ?

## 2021-06-22 LAB — BIRTH TISSUE RECOVERY COLLECTION (PLACENTA DONATION)

## 2021-06-22 MED ORDER — ACETAMINOPHEN 500 MG PO TABS
1000.0000 mg | ORAL_TABLET | Freq: Three times a day (TID) | ORAL | 0 refills | Status: AC | PRN
Start: 2021-06-22 — End: ?

## 2021-06-22 MED ORDER — IBUPROFEN 600 MG PO TABS: 600.0000 mg | ORAL_TABLET | Freq: Four times a day (QID) | ORAL | 0 refills | Status: AC | PRN

## 2021-06-30 ENCOUNTER — Telehealth (HOSPITAL_COMMUNITY): Payer: Self-pay | Admitting: *Deleted

## 2021-06-30 NOTE — Telephone Encounter (Signed)
Left phone voicemail message. ? ?Odis Hollingshead, RN 06-30-2021 at 11:49am ?

## 2021-08-02 ENCOUNTER — Ambulatory Visit (INDEPENDENT_AMBULATORY_CARE_PROVIDER_SITE_OTHER): Payer: BC Managed Care – PPO | Admitting: Obstetrics and Gynecology

## 2021-08-02 ENCOUNTER — Encounter: Payer: Self-pay | Admitting: Obstetrics and Gynecology

## 2021-08-02 NOTE — Progress Notes (Signed)
Post Partum Visit Note  Emily Barnes is a 31 y.o. [id number] female who presents for a postpartum visit. She is 6 weeks postpartum following a normal spontaneous vaginal delivery.  I have fully reviewed the prenatal and intrapartum course. The delivery was at [redacted]w[redacted]d gestational weeks.  Anesthesia: epidural. Postpartum course has been unremarkable. Baby is doing well. Baby is feeding by breast. Bleeding no bleeding. Bowel function is normal. Bladder function is normal. Patient is not sexually active. Contraception method is none. Postpartum depression screening: negative. EPDS=1   The pregnancy intention screening data noted above was reviewed. Potential methods of contraception were discussed. The patient elected to consider progesterone iud or POPs   Edinburgh Postnatal Depression Scale - 08/02/21 1458       Edinburgh Postnatal Depression Scale:  In the Past 7 Days   I have been able to laugh and see the funny side of things. 0    I have looked forward with enjoyment to things. 0    I have blamed myself unnecessarily when things went wrong. 1    I have been anxious or worried for no good reason. 0    I have felt scared or panicky for no good reason. 0    Things have been getting on top of me. 0    I have been so unhappy that I have had difficulty sleeping. 0    I have felt sad or miserable. 0    I have been so unhappy that I have been crying. 0    The thought of harming myself has occurred to me. 0    Edinburgh Postnatal Depression Scale Total 1             Health Maintenance Due  Topic Date Due   COVID-19 Vaccine (1) Never done    The following portions of the patient's history were reviewed and updated as appropriate: allergies, current medications, past family history, past medical history, past social history, past surgical history, and problem list.  Review of Systems Pertinent items are noted in HPI.  Objective:  BP 110/74   Pulse 72   Ht 5' 9.5" (1.765 m)    Wt 272 lb (123.4 kg)   LMP 09/15/2020   Breastfeeding Yes   BMI 39.59 kg/m    General:  alert, cooperative, and no distress   Breasts:  not indicated  Lungs: clear to auscultation bilaterally  Heart:  regular rate and rhythm  Abdomen: soft, non-tender; bowel sounds normal; no masses,  no organomegaly   Wound N/a  GU exam:  not indicated       Assessment:     normal postpartum exam.   Plan:   Essential components of care per ACOG recommendations:  1.  Mood and well being: Patient with negative depression screening today. Reviewed local resources for support.  - Patient tobacco use? No.   - hx of drug use? No.    2. Infant care and feeding:  -Patient currently breastmilk feeding? Yes. Reviewed importance of draining breast regularly to support lactation.  -Social determinants of health (SDOH) reviewed in EPIC. No concerns.  3. Sexuality, contraception and birth spacing - Patient does not want a pregnancy in the next year.  Desired family size is 2 children.  - Reviewed reproductive life planning. Reviewed contraceptive methods based on pt preferences and effectiveness.  Patient desired IUD or IUS or POPs, nothing today. - Discussed birth spacing of 18 months  4. Sleep and fatigue -Encouraged family/partner/community support of  4 hrs of uninterrupted sleep to help with mood and fatigue  5. Physical Recovery  - Discussed patients delivery and complications. She describes her labor as good. - Patient had a Vaginal, no problems at delivery. Patient had no laceration.   - Patient has urinary incontinence? No. - Patient is safe to resume physical and sexual activity  6.  Health Maintenance - HM due items addressed Yes - Last pap smear  Diagnosis  Date Value Ref Range Status  12/15/2020   Final   - Negative for intraepithelial lesion or malignancy (NILM)   Pap smear not done at today's visit.  -Breast Cancer screening indicated? No.   7. Chronic Disease/Pregnancy  Condition follow up: None  Pt has received info on Mirena.  She will call when she is ready to consider placement. F/U in 1 year  Warden Fillers, MD [facility] [employer]

## 2022-01-28 ENCOUNTER — Ambulatory Visit
Admission: RE | Admit: 2022-01-28 | Discharge: 2022-01-28 | Disposition: A | Discharge status: Home / Self Care | Payer: BC Managed Care – PPO | Source: Ambulatory Visit | Attending: Physician Assistant | Admitting: Physician Assistant

## 2022-01-28 VITALS — BP 116/79 | HR 104 | Temp 98.3°F | Resp 18

## 2022-01-28 DIAGNOSIS — R112 Nausea with vomiting, unspecified: Secondary | ICD-10-CM | POA: Insufficient documentation

## 2022-01-28 DIAGNOSIS — J029 Acute pharyngitis, unspecified: Secondary | ICD-10-CM | POA: Diagnosis present

## 2022-01-28 DIAGNOSIS — Z1152 Encounter for screening for COVID-19: Secondary | ICD-10-CM | POA: Insufficient documentation

## 2022-01-28 DIAGNOSIS — H6501 Acute serous otitis media, right ear: Secondary | ICD-10-CM | POA: Insufficient documentation

## 2022-01-28 LAB — RESP PANEL BY RT-PCR (FLU A&B, COVID) ARPGX2
Influenza A by PCR: NEGATIVE
Influenza B by PCR: NEGATIVE
SARS Coronavirus 2 by RT PCR: NEGATIVE

## 2022-01-28 LAB — POCT RAPID STREP A (OFFICE): Rapid Strep A Screen: NEGATIVE

## 2022-01-28 MED ORDER — AMOXICILLIN 500 MG PO CAPS: 500.0000 mg | ORAL_CAPSULE | Freq: Three times a day (TID) | ORAL | 0 refills | Status: AC

## 2022-01-28 NOTE — ED Triage Notes (Signed)
Pt dis present today with c/o hot flashes, chills, diarrhea, sore throat, cough, and congestion.  Onset~x4 days

## 2022-01-28 NOTE — Discharge Instructions (Signed)
Advised take the amoxicillin every 8 hours on a regular basis to treat the ear infection.  COVID/flu test will be completed in 48 hours.  If you do not get a call from this office that indicates the test is negative.  Local and a MyChart to review the test results when they post in 48 hours. Advised follow-up PCP or return to urgent care as needed.

## 2022-01-28 NOTE — ED Provider Notes (Signed)
[facility]    CSN: [id number] Arrival date & time: 01/28/22  1252      History   Chief Complaint Chief Complaint  Patient presents with   Sore Throat    I have come down with flu/ Covid like symptoms and need to get a test done. I took an at home Covid test and started it was negative but i need to check for flu. I have sore throat, cold sweats, nausea, diarrhea, fatigue, ear pain and headache. - Entered by patient   Cough   Chills   Diarrhea    HPI Emily Barnes is a 31 y.o. female.   31 year old female presents with sore throat, earache, cough congestion.  Patient indicates she is breast-feeding.  Patient relates for the past 5 days she has been having cough, congestion, upper respiratory congestion with rhinitis postnasal drip, production has been clear but thick.  She also indicates she has having sore throat and painful swallowing.  She indicates she is having bilateral ear pain with the right being worse than the left.  She relates that along with the cough and congestion she has been having nausea, vomiting, and diarrhea.  She relates the symptoms have improved over the past 48 hours.  She indicates she did do a COVID test yesterday which was negative.  Patient relates that she is concerned about possibly having flu and she was advised by her job to get a flu test.  She is tolerating fluids well.   Sore Throat  Cough Associated symptoms: ear pain (right) and sore throat   Diarrhea   Past Medical History:  Diagnosis Date   Headache    Medical history non-contributory     Patient Active Problem List   Diagnosis Date Noted   Vaginal delivery 06/21/2021   Encounter for elective induction of labor 06/20/2021   BMI 40.0-44.9, adult ([place]) 04/28/2021   Encounter for supervision of other normal pregnancy, third trimester 11/26/2020    Past Surgical History:  Procedure Laterality Date   CYST EXCISION Left    axilla    OB History     Gravida  2    Para  2   Term  2   Preterm  0   AB  0   Living  2      SAB  0   IAB  0   Ectopic  0   Multiple  0   Live Births  2            Home Medications    Prior to Admission medications   Medication Sig Start Date End Date Taking? Authorizing Provider  amoxicillin (AMOXIL) 500 MG capsule Take 1 capsule (500 mg total) by mouth 3 (three) times daily. 01/28/22  Yes Nyoka Lint, PA-C  acetaminophen (TYLENOL) 500 MG tablet Take 2 tablets (1,000 mg total) by mouth every 8 (eight) hours as needed (pain). Patient not taking: Reported on 08/02/2021 06/22/21   Genia Del, MD  Blood Pressure Monitoring (BLOOD PRESSURE KIT) DEVI 1 kit by Does not apply route once a week. Patient not taking: Reported on 08/02/2021 11/26/20   Chancy Milroy, MD  ibuprofen (ADVIL) 600 MG tablet Take 1 tablet (600 mg total) by mouth every 6 (six) hours as needed (pain). Patient not taking: Reported on 08/02/2021 06/22/21   Genia Del, MD  Prenatal MV & Min w/FA-DHA (PRENATAL GUMMIES PO) Take by mouth.    [provider]    Family History Family History  Problem Relation Age of Onset   Diabetes Father    Alcohol abuse Neg Hx    Arthritis Neg Hx    Asthma Neg Hx    Birth defects Neg Hx    Cancer Neg Hx    COPD Neg Hx    Depression Neg Hx    Drug abuse Neg Hx    Early death Neg Hx    Hearing loss Neg Hx    Heart disease Neg Hx    Hyperlipidemia Neg Hx    Hypertension Neg Hx    Kidney disease Neg Hx    Learning disabilities Neg Hx    Mental illness Neg Hx    Mental retardation Neg Hx    Miscarriages / Stillbirths Neg Hx    Stroke Neg Hx    Vision loss Neg Hx    Varicose Veins Neg Hx     Social History Social History   Tobacco Use   Smoking status: Never   Smokeless tobacco: Never  Vaping Use   Vaping Use: Never used  Substance Use Topics   Alcohol use: Not Currently    Comment: occ, but not since confirmed pregnancy   Drug use: Not Currently    Comment:  CBD and Hemp prior to pregnancy     Allergies   Tape   Review of Systems Review of Systems  HENT:  Positive for ear pain (right), postnasal drip, sinus pressure and sore throat.   Respiratory:  Positive for cough.   Gastrointestinal:  Positive for diarrhea.     Physical Exam Triage Vital Signs ED Triage Vitals  Enc Vitals Group     BP 01/28/22 1323 116/79     Pulse Rate 01/28/22 1323 (!) 104     Resp 01/28/22 1323 18     Temp 01/28/22 1323 98.3 F (36.8 C)     Temp src --      SpO2 01/28/22 1323 98 %     Weight --      Height --      Head Circumference --      Peak Flow --      Pain Score 01/28/22 1322 10     Pain Loc --      Pain Edu? --      Excl. in [place]? --    No data found.  Updated Vital Signs BP 116/79   Pulse (!) 104   Temp 98.3 F (36.8 C)   Resp 18   SpO2 98%   Breastfeeding Yes   Visual Acuity Right Eye Distance:   Left Eye Distance:   Bilateral Distance:    Right Eye Near:   Left Eye Near:    Bilateral Near:     Physical Exam Constitutional:      Appearance: She is well-developed.  HENT:     Right Ear: Ear canal normal. Tympanic membrane is erythematous.     Left Ear: Ear canal normal. Tympanic membrane is injected.     Mouth/Throat:     Mouth: Mucous membranes are moist.     Pharynx: Posterior oropharyngeal erythema present. No oropharyngeal exudate.  Cardiovascular:     Rate and Rhythm: Normal rate and regular rhythm.     Heart sounds: Normal heart sounds.  Pulmonary:     Effort: Pulmonary effort is normal.     Breath sounds: Normal breath sounds and air entry. No wheezing, rhonchi or rales.  Abdominal:     General: Abdomen is flat. Bowel sounds are normal.  Palpations: Abdomen is soft.     Tenderness: There is no abdominal tenderness.  Lymphadenopathy:     Cervical: No cervical adenopathy.  Neurological:     Mental Status: She is alert.      UC Treatments / Results  Labs (all labs ordered are listed, but only  abnormal results are displayed) Labs Reviewed  RESP PANEL BY RT-PCR (FLU A&B, COVID) [id number]  POCT RAPID STREP A (OFFICE)    EKG   Radiology No results found.  Procedures Procedures (including critical care time)  Medications Ordered in UC Medications - No data to display  Initial Impression / Assessment and Plan / UC Course  I have reviewed the triage vital signs and the nursing notes.  Pertinent labs & imaging results that were available during my care of the patient were reviewed by me and considered in my medical decision making (see chart for details).    Plan: 1.  The sore throat will be treated with the following: A.  Advised lozenges to help soothe the throat. 2.  The nausea and vomiting will be treated with the following. A.  Advised symptomatic treatment with bland diet and fluids. 3.  The otitis media will be treated with the following: A.  Amoxicillin 500 mg every 8 hours for 7 days to treat the ear infection. 4.  COVID-19 screening will be treated with the following: A.  Treatment will be modified depending on the results of the COVID/flu test.  (If COVID is positive there is no treatment, if the flu is positive then consider Tamiflu however the patient is breast-feeding) 5.  Advised follow-up PCP or return to urgent care as needed. Final Clinical Impressions(s) / UC Diagnoses   Final diagnoses:  Sore throat  Nausea and vomiting, unspecified vomiting type  Non-recurrent acute serous otitis media of right ear  Encounter for screening for COVID-19     Discharge Instructions      Advised take the amoxicillin every 8 hours on a regular basis to treat the ear infection.  COVID/flu test will be completed in 48 hours.  If you do not get a call from this office that indicates the test is negative.  Local and a MyChart to review the test results when they post in 48 hours. Advised follow-up PCP or return to urgent care as needed.     ED Prescriptions      Medication Sig Dispense Auth. Provider   amoxicillin (AMOXIL) 500 MG capsule Take 1 capsule (500 mg total) by mouth 3 (three) times daily. 21 capsule Nyoka Lint, PA-C      PDMP not reviewed this encounter.   Nyoka Lint, PA-C 01/28/22 1352

## 2023-05-26 IMAGING — US US OB LIMITED
1 series · 6 of 6 positions shown · non-contrast
Comparison: none

[Series 1: us ob limited · 6 of 6 slices shown]
[im 1/6]
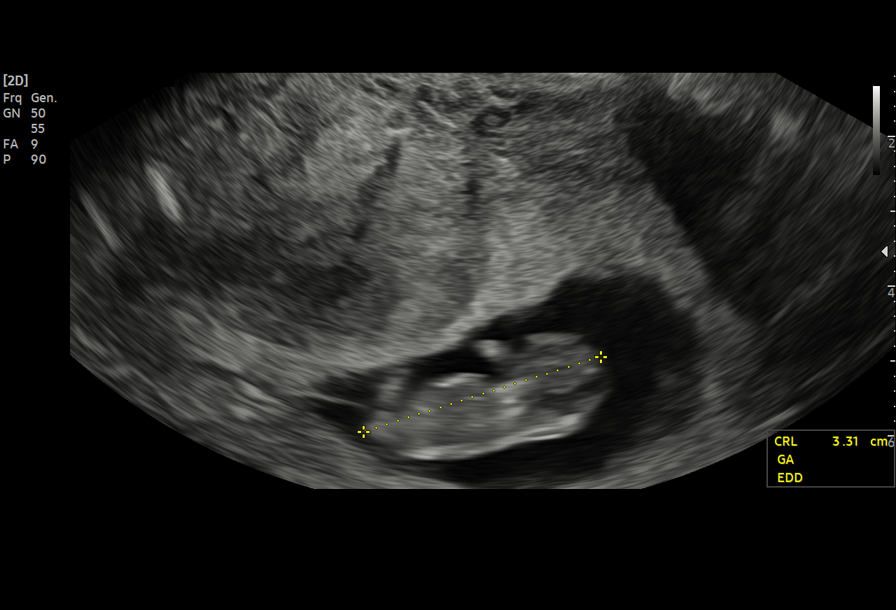
[im 2/6]
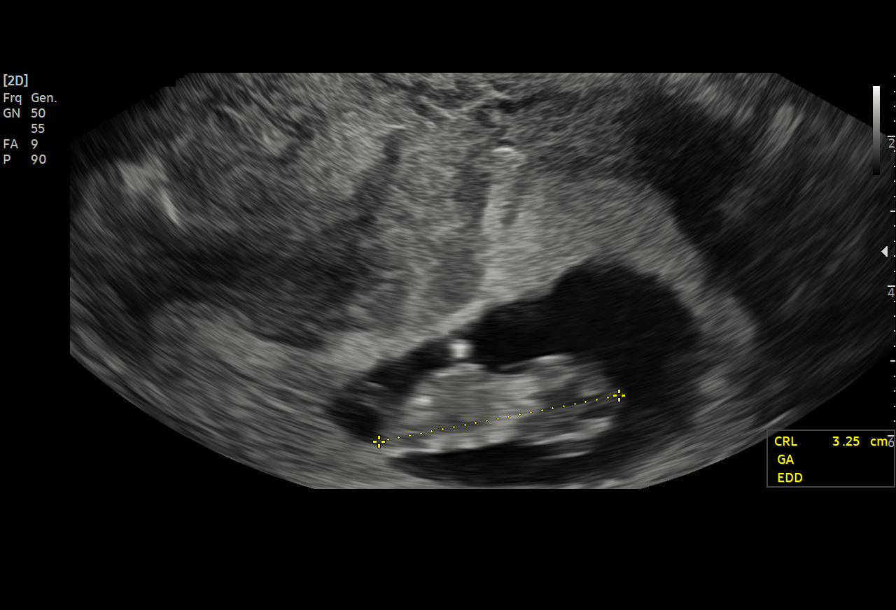
[im 3/6]
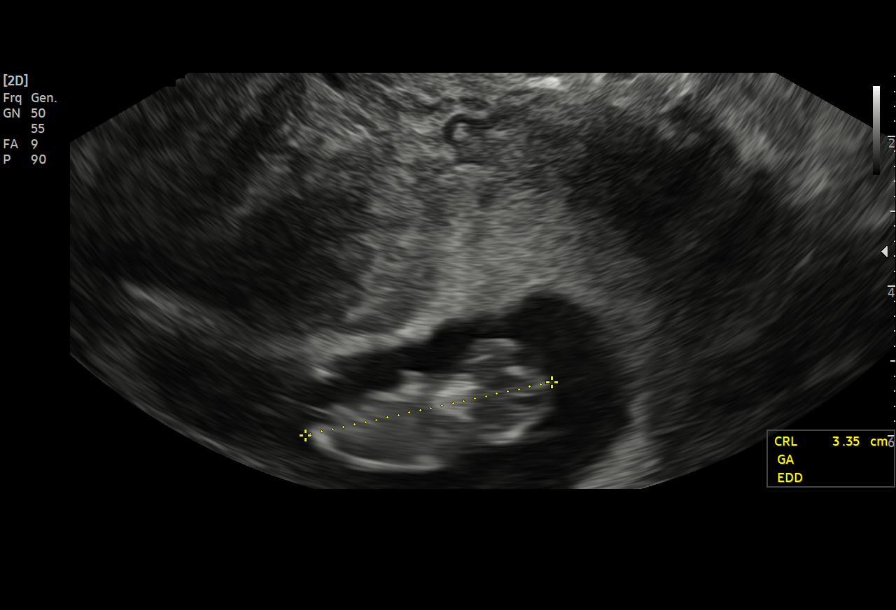
[im 4/6]
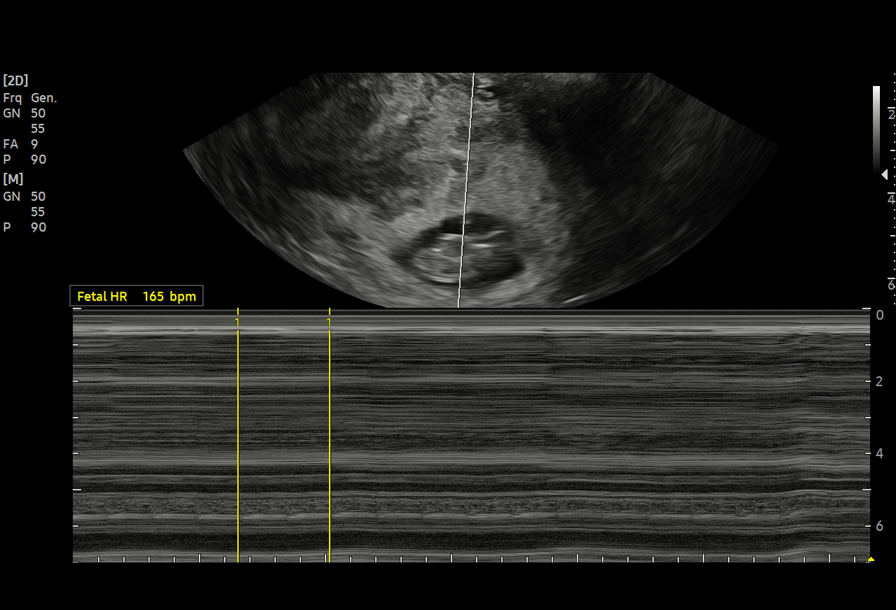
[im 5/6]
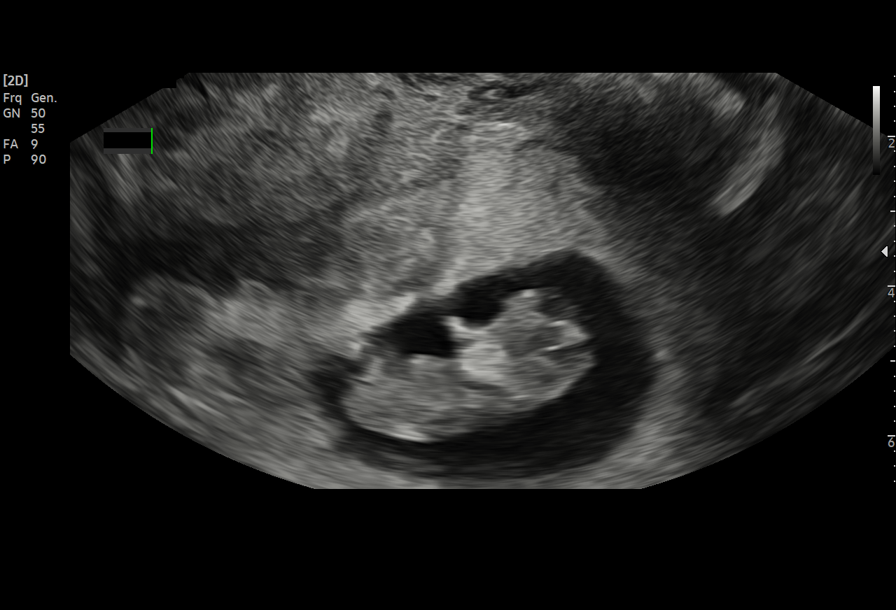
[im 6/6]
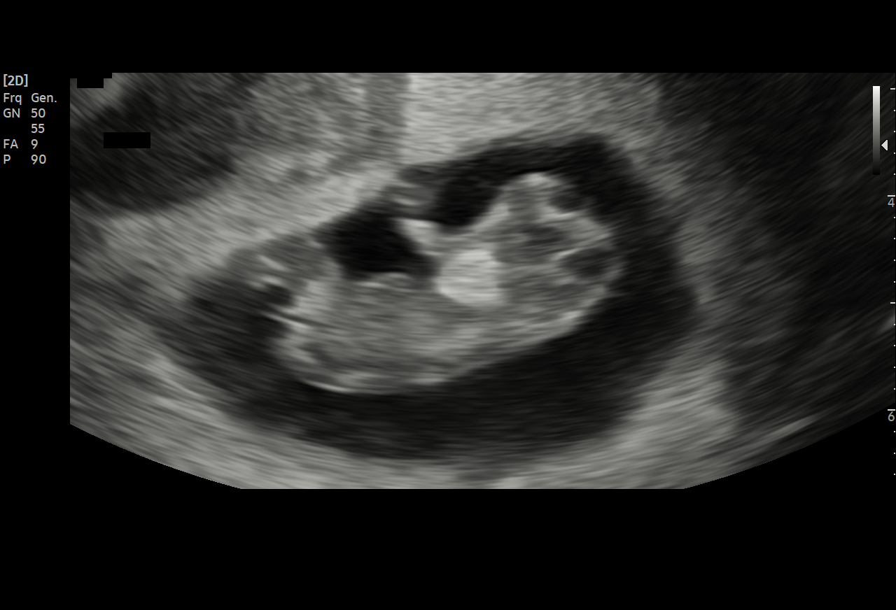

[6 of 6 positions shown; findings below may reference images not displayed]

[REDACTED]care

 1  [HOSPITAL]                         [address].0     [facility]

Indications

 10 weeks gestation of pregnancy
Fetal Evaluation

 Num Of Fetuses:         1
 Fetal Heart Rate(bpm):  165
 Cardiac Activity:       Observed
Biometry

 CRL:        33  mm     G. Age:  10w 0d                  EDD:   06/24/21
Gestational Age

 Best:          10w 0d     Det. By:  U/S C R L (11/26/20)     EDD:   06/24/21
Comments

 Single live IUP at 64w4d by CRL. Measurements are
 consistent with LMP 09/15/20 provided.
Impression

 Single IUP 64w4d by CRL
 Positive cardiac activity
Recommendations

 F/U OB U/S as indicated

## 2023-10-27 IMAGING — US US MFM OB FOLLOW-UP
1 series · 14 of 28 positions shown · non-contrast
Comparison: none

[Series 1: us mfm ob follow-up · 50 acquisitions, 14 frames shown]
[im 2/50]
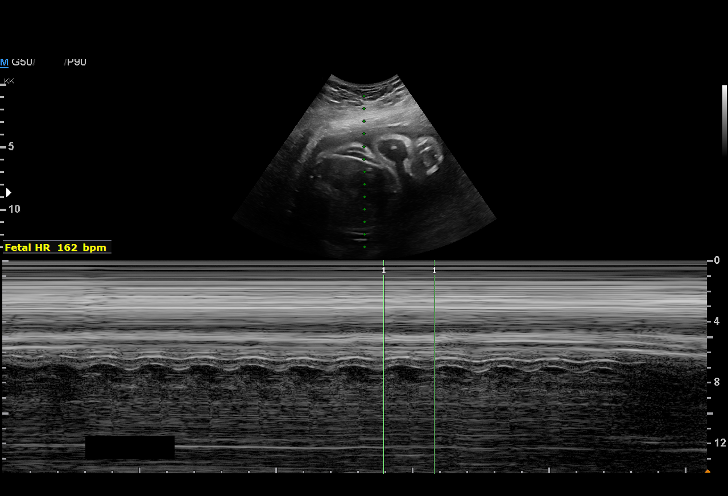
[im 6/50]
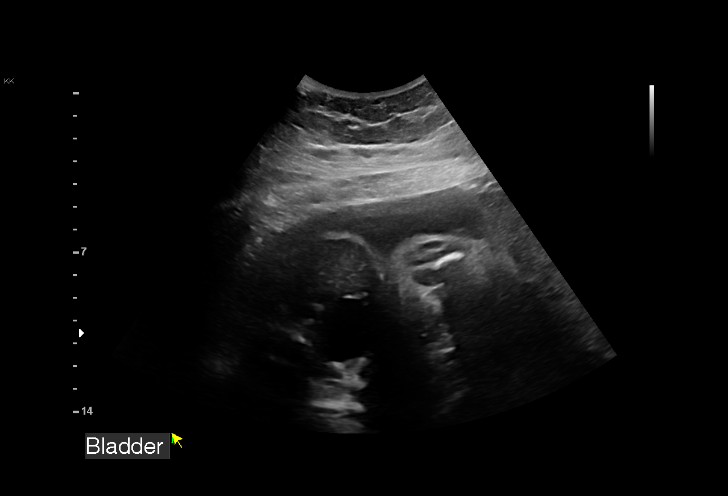
[im 10/50]
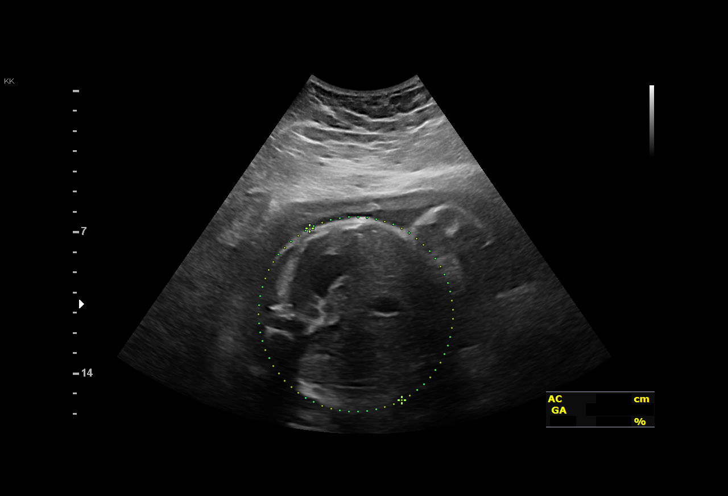
[im 13/50]
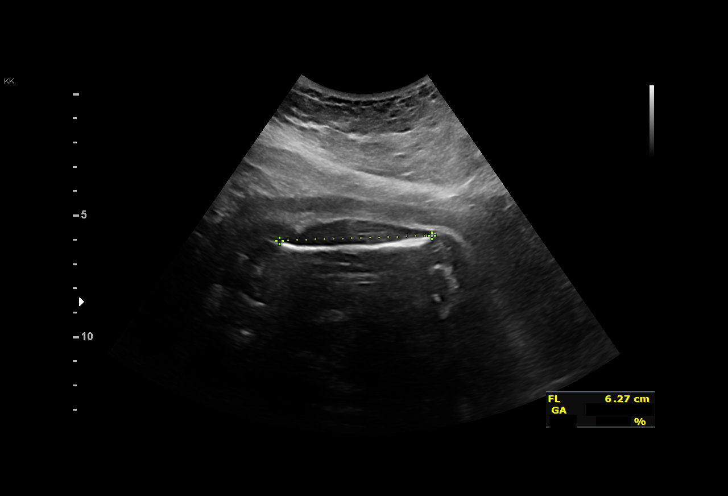
[im 17/50]
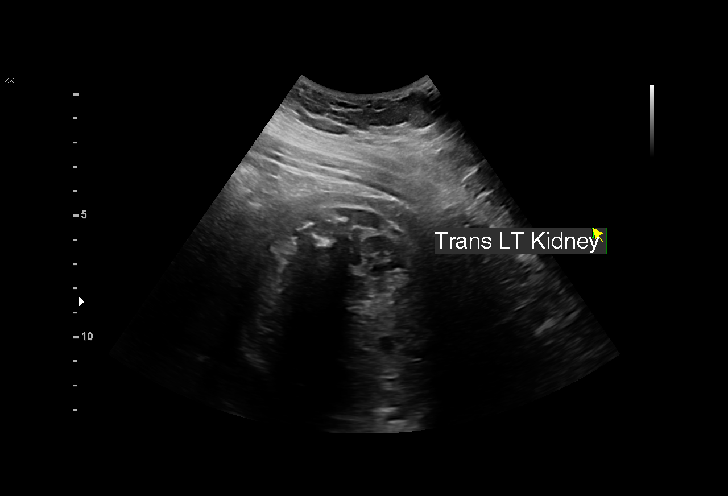
[im 20/50]
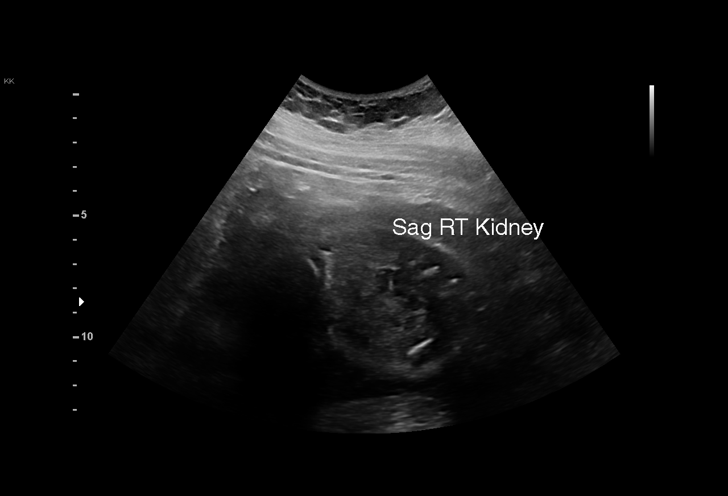
[im 24/50]
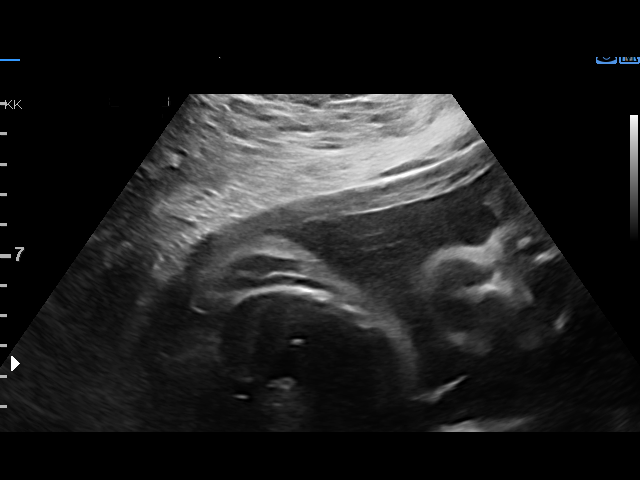
[im 28/50]
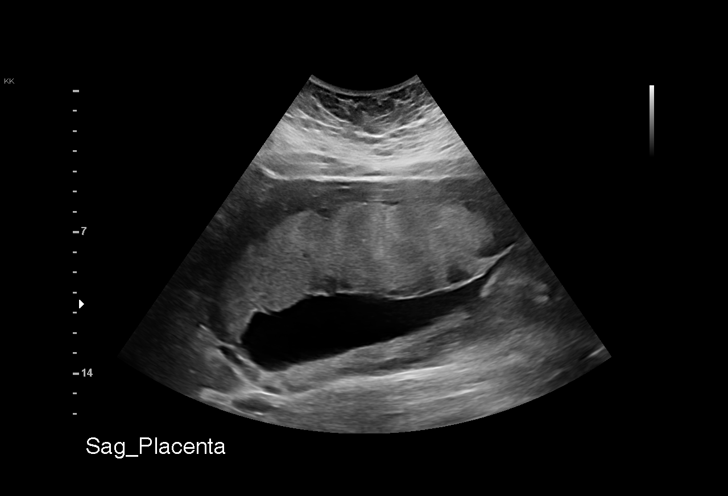
[im 31/50]
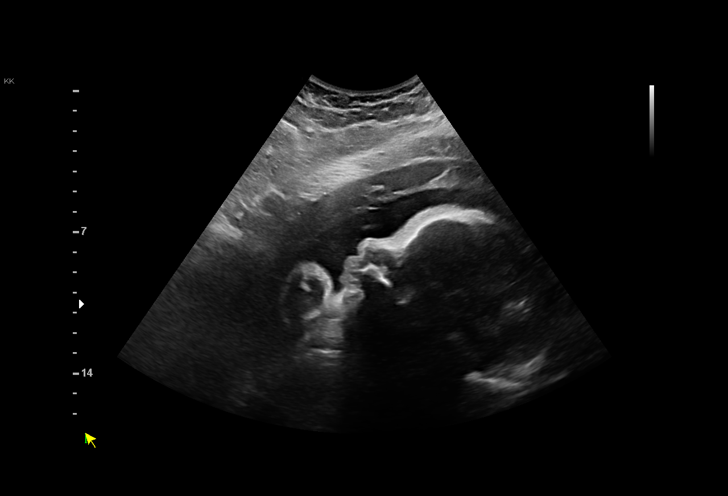
[im 35/50]
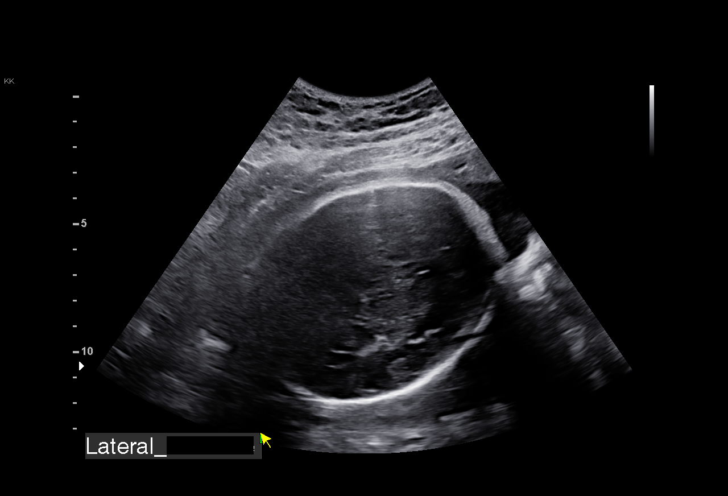
[im 39/50]
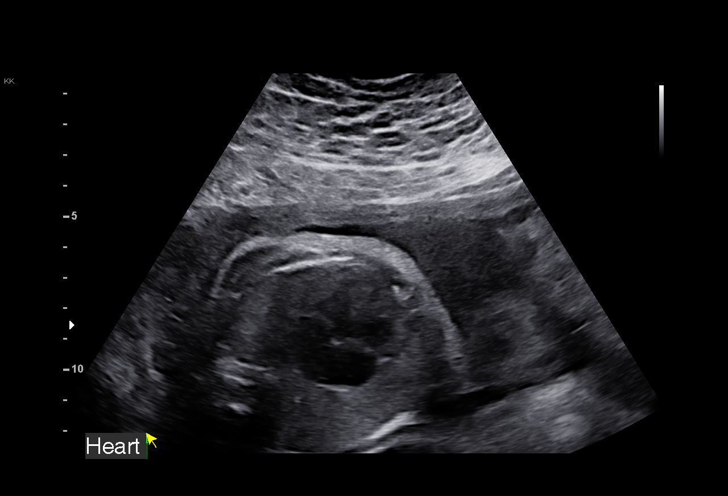
[im 42/50]
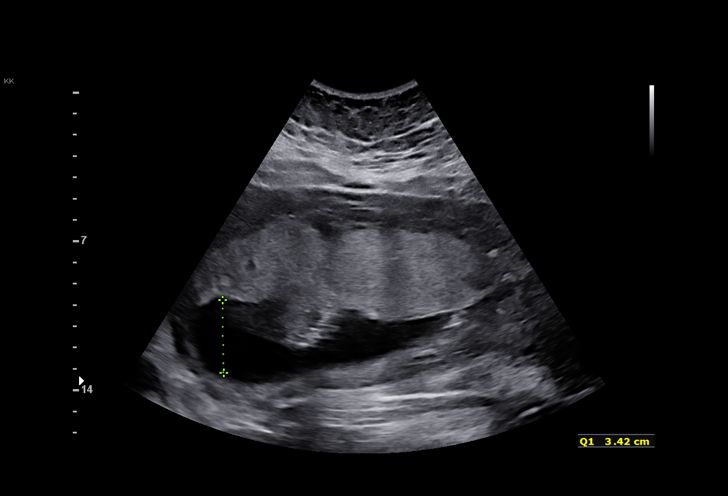
[im 46/50]
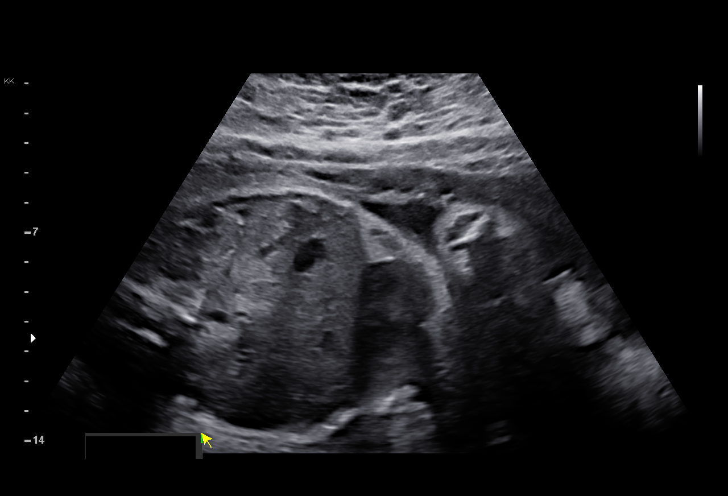
[im 50/50]
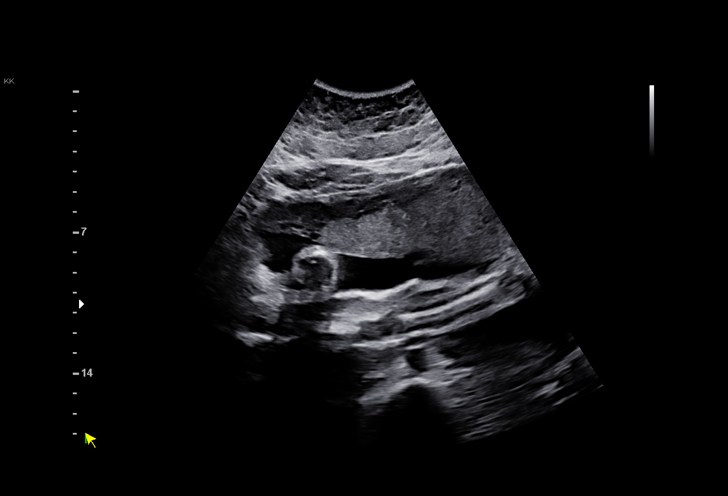

[14 of 28 positions shown; findings below may reference images not displayed]

Indications

 Obesity complicating pregnancy, second
 trimester BMI 39)
 32 weeks gestation of pregnancy
 LR NIPS/ Negative Horizon/ AFP
Vital Signs

 BMI:
Fetal Evaluation

 Num Of Fetuses:          1
 Fetal Heart Rate(bpm):   162
 Cardiac Activity:        Observed
 Presentation:            Cephalic
 Placenta:                Anterior
 P. Cord Insertion:       Previously Visualized

 Amniotic Fluid
 AFI FV:      Within normal limits

 AFI Sum(cm)     %Tile       Largest Pocket(cm)
 14.6            51

 RUQ(cm)       RLQ(cm)       LUQ(cm)        LLQ(cm)

Biometry

 BPD:      80.4  mm     G. Age:  32w 2d         50  %    CI:        75.55   %    70 - 86
                                                         FL/HC:       22.0  %    19.1 -
 HC:      293.3  mm     G. Age:  32w 3d         22  %    HC/AC:       0.99       0.96 -
 AC:      297.3  mm     G. Age:  33w 5d         90  %    FL/BPD:      80.3  %    71 - 87
 FL:       64.6  mm     G. Age:  33w 2d         73  %    FL/AC:       21.7  %    20 - 24

 Est. FW:    8509   gm    4 lb 13 oz     81  %
Gestational Age

 U/S Today:     33w 0d                                        EDD:   06/17/21
 Best:          32w 0d     Det. By:  U/S C R L  (11/26/20)    EDD:   06/24/21
Anatomy

 Cranium:               Appears normal         LVOT:                   Previously seen
 Cavum:                 Previously seen        Aortic Arch:            Previously seen
 Ventricles:            Appears normal         Ductal Arch:            Previously seen
 Choroid Plexus:        Previously seen        Diaphragm:              Appears normal
 Cerebellum:            Previously seen        Stomach:                Appears normal, left
                                                                       sided
 Posterior Fossa:       Previously seen        Abdomen:                Appears normal
 Nuchal Fold:           Previously seen        Abdominal Wall:         Previously seen
 Face:                  Orbits appear          Cord Vessels:           Previously seen
                        normal
 Lips:                  Previously seen        Kidneys:                Appear normal
 Palate:                Previously seen        Bladder:                Appears normal
 Thoracic:              Appears normal         Spine:                  Previously seen
 Heart:                 Appears normal         Upper Extremities:      Previously seen
                        (4CH, axis, and
                        situs)
 RVOT:                  Previously seen        Lower Extremities:      Previously seen

 Other:  Fetus appears to be a male. Heels/feet, hands digits, nasal bone,
         lenses, VC, 3VV and 3VTV previously visualized. Technically difficult
         due to maternal habitus and fetal position.
Cervix Uterus Adnexa

 Cervix
 Not visualized (advanced GA >12wks)
Impression

 Follow up growth due to elevated BMI
 Normal interval growth with measurements consistent with
 dates
 Good fetal movement and amniotic fluid volume
Recommendations

 Repeat growth in 4 weeks
 Initiate weekly testing at 34 weeks.
